# Patient Record
Sex: Female | Born: 1950 | Race: White | Hispanic: No | Marital: Single | Smoking: Current every day smoker
Health system: Southern US, Community
[De-identification: ages and names within clinical notes are randomized; demographics above are authoritative.]

## PROBLEM LIST (undated history)

## (undated) DIAGNOSIS — J449 Chronic obstructive pulmonary disease, unspecified: Secondary | ICD-10-CM

## (undated) DIAGNOSIS — R188 Other ascites: Secondary | ICD-10-CM

## (undated) DIAGNOSIS — K219 Gastro-esophageal reflux disease without esophagitis: Secondary | ICD-10-CM

## (undated) DIAGNOSIS — M1990 Unspecified osteoarthritis, unspecified site: Secondary | ICD-10-CM

## (undated) DIAGNOSIS — K7460 Unspecified cirrhosis of liver: Secondary | ICD-10-CM

## (undated) DIAGNOSIS — I509 Heart failure, unspecified: Secondary | ICD-10-CM

## (undated) DIAGNOSIS — G43909 Migraine, unspecified, not intractable, without status migrainosus: Secondary | ICD-10-CM

## (undated) DIAGNOSIS — N189 Chronic kidney disease, unspecified: Secondary | ICD-10-CM

## (undated) DIAGNOSIS — F419 Anxiety disorder, unspecified: Secondary | ICD-10-CM

---
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
## 1997-07-31 ENCOUNTER — Other Ambulatory Visit: Admission: RE | Admit: 1997-07-31 | Discharge: 1997-07-31 | Payer: Self-pay | Admitting: Gynecology

## 1997-08-27 ENCOUNTER — Other Ambulatory Visit: Admission: RE | Admit: 1997-08-27 | Discharge: 1997-08-27 | Payer: Self-pay | Admitting: Gynecology

## 1999-01-10 ENCOUNTER — Other Ambulatory Visit: Admission: RE | Admit: 1999-01-10 | Discharge: 1999-01-10 | Payer: Self-pay | Admitting: Gynecology

## 1999-02-01 ENCOUNTER — Encounter: Payer: Self-pay | Admitting: Gynecology

## 1999-02-02 ENCOUNTER — Inpatient Hospital Stay (HOSPITAL_COMMUNITY): Admission: RE | Admit: 1999-02-02 | Discharge: 1999-02-03 | Payer: Self-pay | Admitting: Gynecology

## 1999-02-02 ENCOUNTER — Encounter (INDEPENDENT_AMBULATORY_CARE_PROVIDER_SITE_OTHER): Payer: Self-pay | Admitting: Specialist

## 2000-01-26 ENCOUNTER — Other Ambulatory Visit: Admission: RE | Admit: 2000-01-26 | Discharge: 2000-01-26 | Payer: Self-pay | Admitting: Gynecology

## 2000-04-18 ENCOUNTER — Ambulatory Visit (HOSPITAL_COMMUNITY): Admission: RE | Admit: 2000-04-18 | Discharge: 2000-04-18 | Payer: Self-pay | Admitting: Gastroenterology

## 2001-02-26 ENCOUNTER — Other Ambulatory Visit: Admission: RE | Admit: 2001-02-26 | Discharge: 2001-02-26 | Payer: Self-pay | Admitting: Gynecology

## 2002-03-20 ENCOUNTER — Other Ambulatory Visit: Admission: RE | Admit: 2002-03-20 | Discharge: 2002-03-20 | Payer: Self-pay | Admitting: Gynecology

## 2002-09-30 ENCOUNTER — Ambulatory Visit (HOSPITAL_COMMUNITY): Admission: RE | Admit: 2002-09-30 | Discharge: 2002-09-30 | Payer: Self-pay | Admitting: Family Medicine

## 2002-09-30 ENCOUNTER — Encounter: Payer: Self-pay | Admitting: Family Medicine

## 2002-10-24 ENCOUNTER — Ambulatory Visit (HOSPITAL_COMMUNITY): Admission: RE | Admit: 2002-10-24 | Discharge: 2002-10-24 | Payer: Self-pay | Admitting: Neurosurgery

## 2002-10-24 ENCOUNTER — Encounter: Payer: Self-pay | Admitting: Neurosurgery

## 2003-06-02 ENCOUNTER — Other Ambulatory Visit: Admission: RE | Admit: 2003-06-02 | Discharge: 2003-06-02 | Payer: Self-pay | Admitting: Gynecology

## 2003-12-28 ENCOUNTER — Ambulatory Visit (HOSPITAL_COMMUNITY): Admission: RE | Admit: 2003-12-28 | Discharge: 2003-12-28 | Payer: Self-pay | Admitting: Family Medicine

## 2004-01-21 IMAGING — XA IR ANGIO/ADD [PERSON_NAME]
1 series · 16 of 24 positions shown · IV contrast (omnipaque)
Comparison: none

FINDINGS
CLINICAL DATA: PATIENT WITH SEVERE HEADACHES ASSOCIATED WITH VISUAL DISTURBANCES.  MRI SUSPICIOUS
OF INTRACRANIAL ARTERIAL VENOUS MALFORMATION.
FOUR VESSEL CEREBRAL ARTERIOGRAM
FOLLOWING A FULL EXPLANATION OF THE PROCEDURE ALONG WITH THE POTENTIALLY ASSOCIATED COMPLICATIONS,
AN INFORMED WITNESSED CONSENT WAS OBTAINED.
THE RIGHT GROIN WAS PREPPED AND DRAPED IN THE USUAL STERILE FASHION.  THEREAFTER USING A MODIFIED
SELDINGER TECHNIQUE, TRANSFEMORAL ACCESS INTO THE RIGHT COMMON FEMORAL ARTERY WAS OBTAINED WITHOUT
DIFFICULTY.  OVER A 0.035 INCH GUIDE WIRE, A 5 FRENCH PINNACLE SHEATH WAS INSERTED.
THROUGH THIS AND ALSO OVER A 0.035 INCH GUIDE WIRE, A 5 FRENCH JB-1 CATHETER WAS ADVANCED INTO THE
AORTIC ARCH REGION AND SELECTIVE CANNULATION ARTERIOGRAMS WERE PERFORMED OF THE RIGHT VERTEBRAL
ARTERY, THE RIGHT COMMON CAROTID ARTERY, THE LEFT COMMON CAROTID ARTERY AND THE LEFT VERTEBRAL
ARTERY AND THE LEFT SUBCLAVIAN ARTERIES.  THERE WERE NO ACUTE COMPLICATIONS AND THE PATIENT
TOLERATED THE PROCEDURE WELL.
MEDICATIONS UTILIZED:  VERSED 2 MG IV.  FENTANYL 50 MICROGRAMS IV.
CONTRAST UTILIZED:  OMNIPAQUE 300, APPROXIMATELY 85 CC.

[Series 1: run · 16 of 116 slices shown]
[im 1/116]
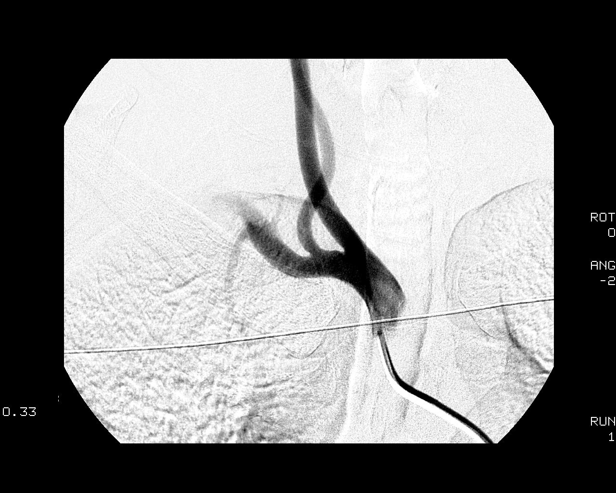
[im 11/116]
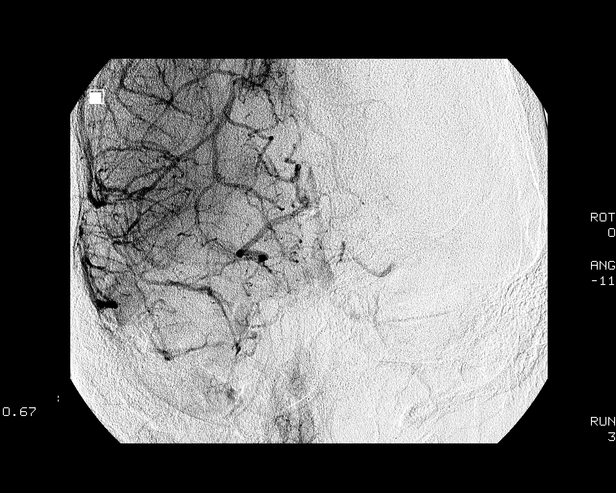
[im 16/116]
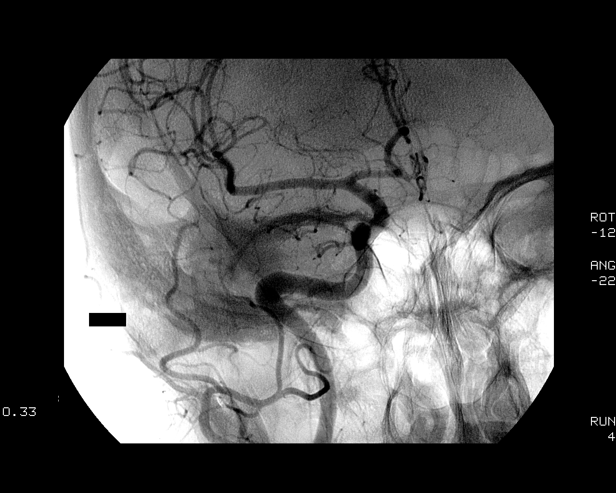
[im 26/116]
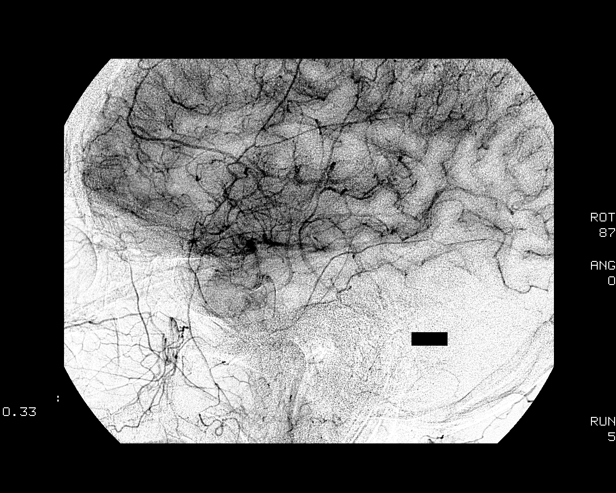
[im 31/116]
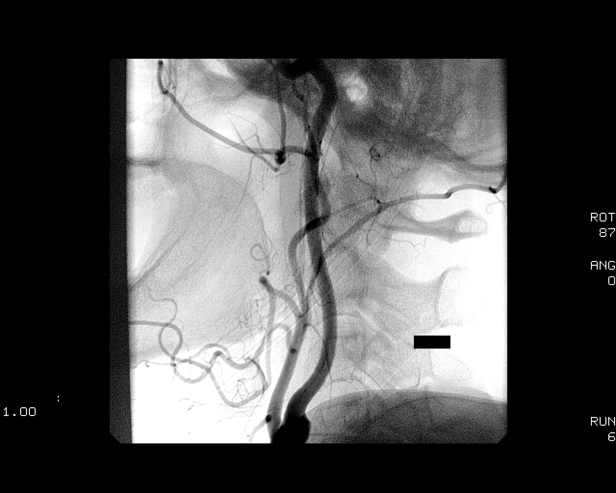
[im 41/116]
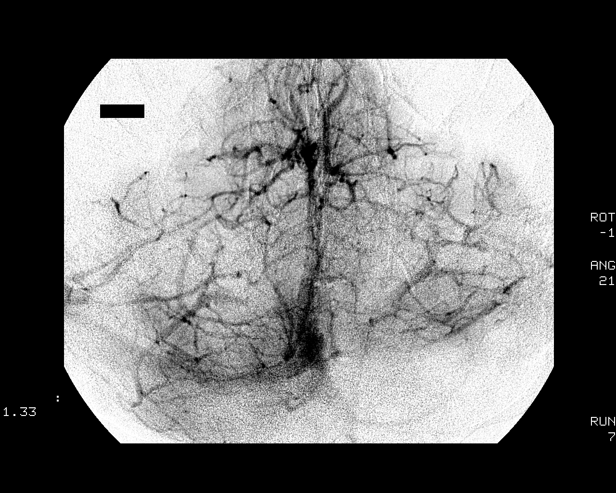
[im 46/116]
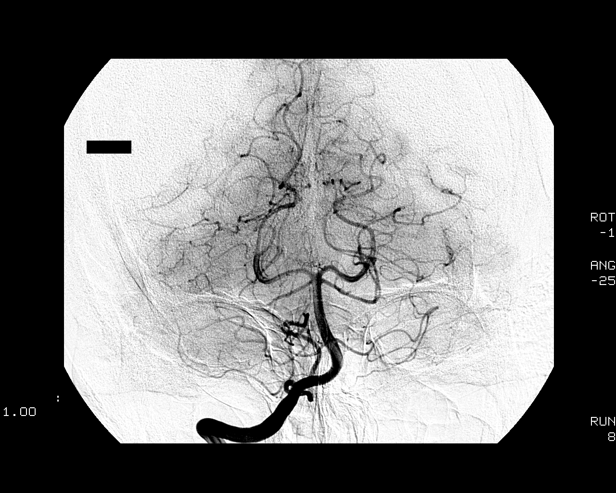
[im 56/116]
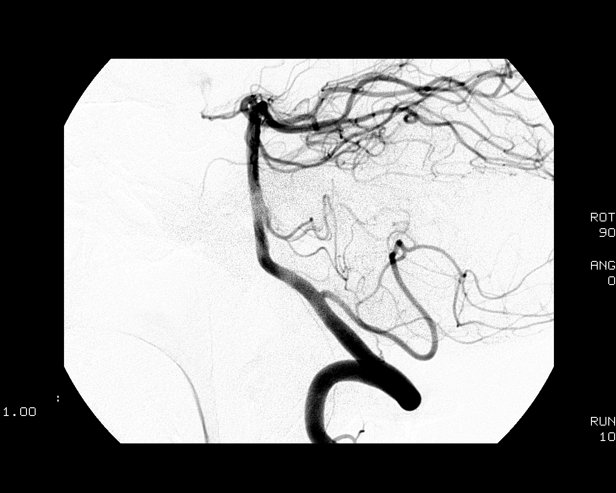
[im 61/116]
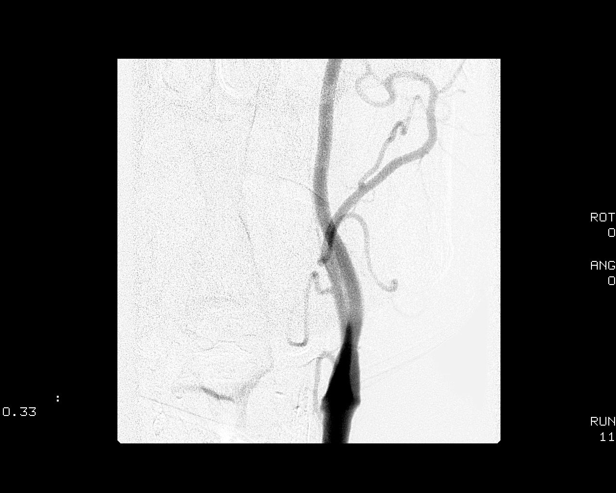
[im 71/116]
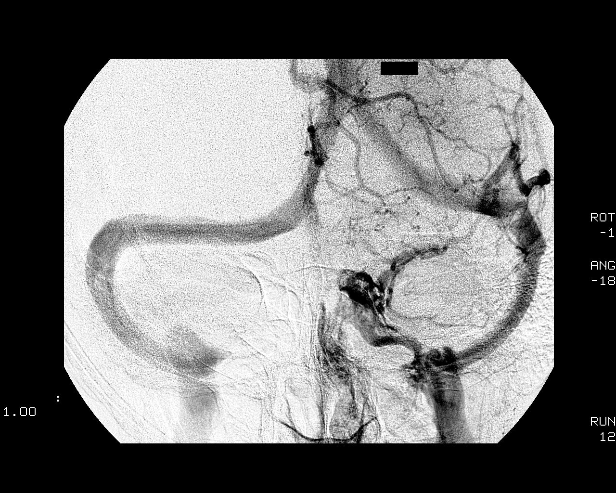
[im 76/116]
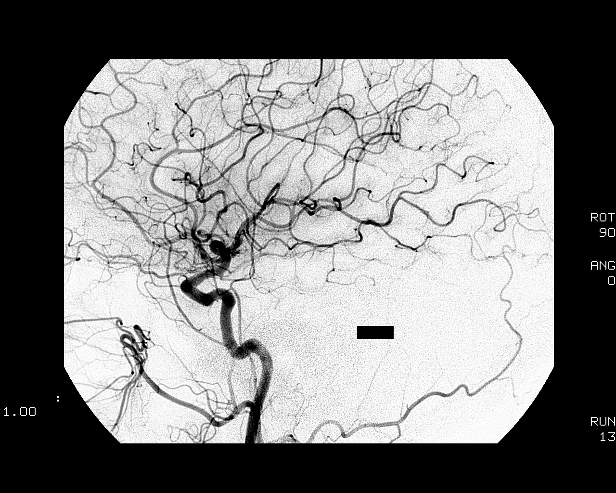
[im 86/116]
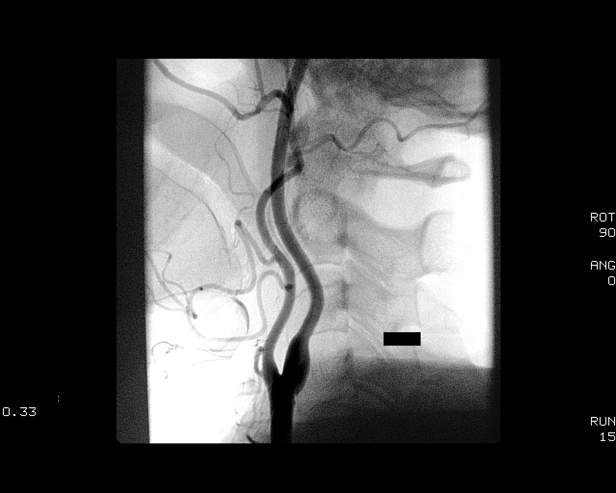
[im 91/116]
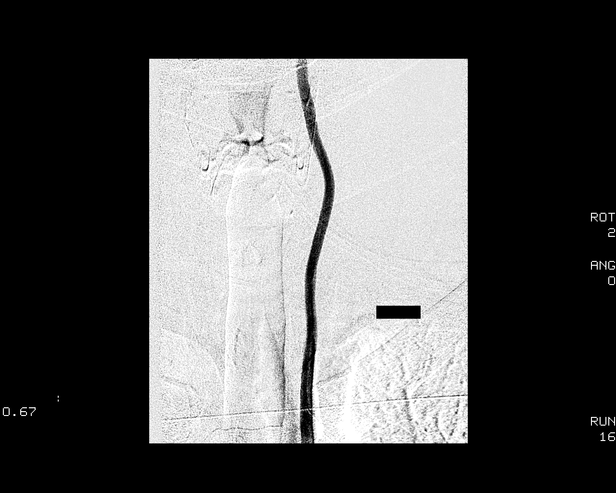
[im 101/116]
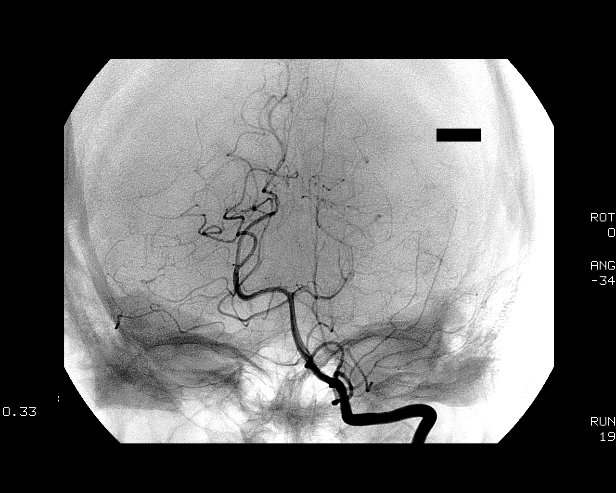
[im 106/116]
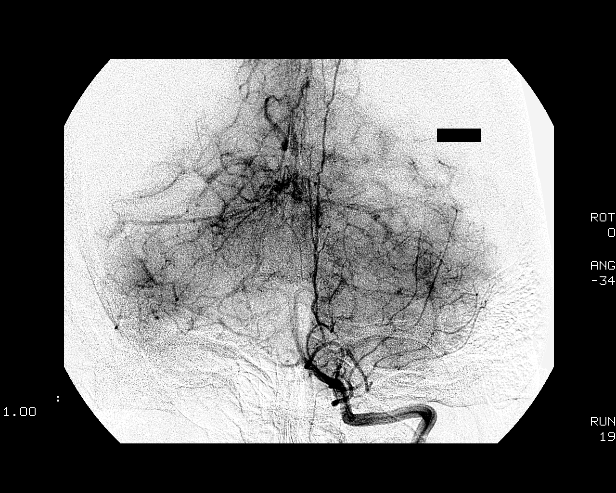
[im 116/116]
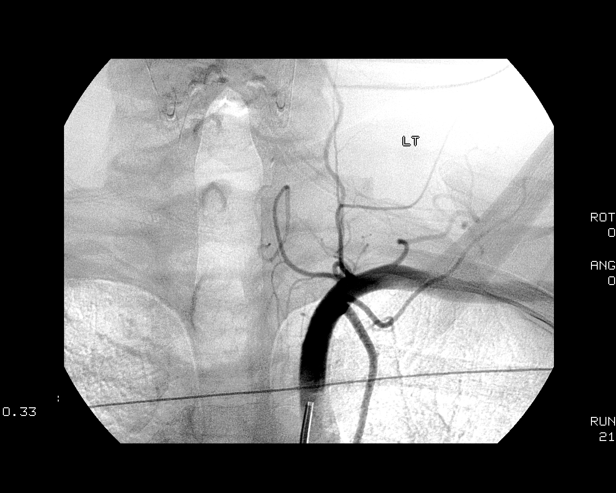

[16 of 24 positions shown; findings below may reference images not displayed]

FINDINGS: THE RIGHT VERTEBRAL ARTERY ORGIN IS NORMAL.  THIS VESSEL ASCENDS NORMALLY THROUGH THE
CRANIAL SKULL BASE.  THE RIGHT VERTEBROBASILAR JUNCTION, THE RIGHT POSTERIOR-INFERIOR CEREBELLAR
ARTERY, THE POSTERIOR CEREBRAL ARTERIES, THE SUPERIOR CEREBELLAR ARTERIES AND THE ANTERIOR-INFERIOR
CEREBELLAR ARTERIES OPACIFY NORMALLY INTO CAPILLARY AND VENOUS PHASES.
THE RIGHT COMMON CAROTID ARTERIOGRAM DEMONSTRATES THE RIGHT EXTERNAL CAROTID ARTERY ORIGIN AND
BRANCHES TO BE NORMAL.
THE RIGHT INTERNAL CAROTID ARTERY AT THE BULB AND DISTALLY IS NORMAL.  THE PETROUS, CAVERNOUS AND
THE SUPRACLINOID SEGMENTS ARE NORMALLY OPACIFIED.
THERE IS A 1.5 MM SACCULAR ANEURYSM IN THE CAVAL PORTION OF THE RIGHT INTERNAL CAROTID ARTERY.  AN
INFUNDIBULUM IS SEEN IN THE REGION OF THE RIGHT P-COM.
THE RIGHT MIDDLE AND THE RIGHT ANTERIOR CEREBRAL ARTERIES OPACIFY NORMALLY INTO CAPILLARY AND
VENOUS PHASES.  TRANSIT OPACIFICATION OF THE A-COM IS NOTED FROM THE RIGHT INTERNAL CAROTID ARTERY
INJECTION AND IS NORMAL.
THE LEFT COMMON CAROTID ARTERIOGRAM DEMONSTRATES THE LEFT EXTERNAL CAROTID ARTERY ORIGIN AND
BRANCHES TO BE NORMAL.  THE LEFT INTERNAL CAROTID ARTERY AT THE BULB AND DISTALLY IS NORMAL.  THE
PETROUS, CAVERNOUS AND THE SUPRACLINOID SEGMENTS ARE NORMALLY OPACIFIED.  THE LEFT MIDDLE AND LEFT
ANTERIOR CEREBRAL ARTERIES ALSO OPACIFY NORMALLY INTO THE CAPILLARY AND THE VENOUS PHASES.
THERE OPACIFICATION OF THE A-COM FROM THE LEFT INTERNAL CAROTID ARTERY INJECTION AND IS GROSSLY
NORMAL.
THE LEFT VERTEBRAL ARTERY ARISES FROM THE AORTIC ARCH.  THIS VESSEL ASCENDS NORMALLY TO THE CRANIAL
SKULL BASE.  THERE IS NORMAL OPACIFICATION OF THE LEFT VERTEBROBASILAR JUNCTION, THE LEFT POSTERIOR-
INFERIOR CEREBELLAR ARTERY, THE BASILAR ARTERY AND THE VACULAR PORTION OF THE POSTERIOR CEREBRAL
ARTERIES, THE SUPERIOR CEREBELLAR ARTERIES AND THE ANTERIOR-INFERIOR CEREBELLAR ARTERIES TO BE
NORMAL INTO CAPILLARY AND VENOUS PHASES.
THE LEFT SUBCLAVIAN ARTERIOGRAM DEMONSTRATES THE ORIGINS TO BE NORMAL.  THE COSTOCERVICAL TRUNK AND
THYROCERVICAL TRUNK DEMONSTRATE NORMAL BRANCHING PATTERNS AND OPACIFICATION.  DISTAL LEFT
SUBCLAVIAN ARTERY TO THE AXILLARY ARTERY IS ALSO NORMALLY OPACIFIED.
IMPRESSION
1.  NO INTRACRANIAL ANGIOGRAPHIC EVIDENCE OF AN ARTERIAL VENOUS MALFORMATION, DURAL ARTERIAL VENOUS
FISTULA, OR OF EXTRACRANIAL OR INTRACRANIAL DISSECTIONS.
2.  SMALL 1.5 MM SACCULAR ANEURYSM IN THE CAVAL PORTION OF THE RIGHT INTERNAL CAROTID ARTERY.
3.  RIGHT VERTEBRAL ARTERY ARISES FROM THE AORTIC ARCH.
4.  LEFT THYROCERVICAL TRUNK AND COSTOCERVICAL TRUNK TO BE UNREMARKABLE.

## 2004-02-05 ENCOUNTER — Encounter: Admission: RE | Admit: 2004-02-05 | Discharge: 2004-02-05 | Payer: Self-pay | Admitting: Neurosurgery

## 2004-06-20 ENCOUNTER — Other Ambulatory Visit: Admission: RE | Admit: 2004-06-20 | Discharge: 2004-06-20 | Payer: Self-pay | Admitting: Gynecology

## 2005-06-20 ENCOUNTER — Other Ambulatory Visit: Admission: RE | Admit: 2005-06-20 | Discharge: 2005-06-20 | Payer: Self-pay | Admitting: Gynecology

## 2008-02-17 ENCOUNTER — Inpatient Hospital Stay (HOSPITAL_COMMUNITY): Admission: EM | Admit: 2008-02-17 | Discharge: 2008-02-23 | Payer: Self-pay | Admitting: Emergency Medicine

## 2009-02-27 HISTORY — PX: CATARACT EXTRACTION W/ INTRAOCULAR LENS  IMPLANT, BILATERAL: SHX1307

## 2010-07-12 NOTE — Discharge Summary (Signed)
Abigail Crawford, Abigail Crawford                 ACCOUNT NO.:  [id number]   MEDICAL RECORD NO.:  [id number]          PATIENT TYPE:  INP   LOCATION:  5148                         FACILITY:  [facility]   PHYSICIAN:  Richarda Overlie, MD       DATE OF BIRTH:  07-14-1950   DATE OF ADMISSION:  02/17/2008  DATE OF DISCHARGE:  02/23/2008                               DISCHARGE SUMMARY   Kindly refer to discharge summary from February 22, 2008 done by Dr.  Janice Norrie.   Diagnosis remains unchanged.   INTERIM DISCHARGE SUMMARY:  The patient has been hemodynamically stable  and found to be around 97% on room air.  No active wheezing noted.  The  patient's abdominal distention is unchanged.  The patient was continued  on treatments and p.o. antibiotics for SBP as well as her medications  for cirrhosis and ascites to which she has responded well.  No new  culture data on the day of discharge.  Case manager requested to help  arrange for a primary care Yareli Carthen, home health, as well as to help  with her medications.  Social worker requested to provide information  regarding alcohol cessation.   DISCHARGE MEDICATIONS:  1. Lasix 20 mg daily.  2. Ciprofloxacin 500 mg p.o. b.i.d. for 5 days.  3. Propranolol 10 mg p.o. daily.  4. Aldactone/spironolactone 25 mg p.o. daily.  5. Nicotine patch 21 mg per day.  6. Ultram 50 mg p.o. q. 12.      Richarda Overlie, MD  Electronically Signed     NA/MEDQ  D:  02/23/2008  T:  02/23/2008  Job:  [id number]

## 2010-07-12 NOTE — Discharge Summary (Signed)
Abigail Crawford, Abigail Crawford                 ACCOUNT NO.:  [id number]   MEDICAL RECORD NO.:  [id number]          PATIENT TYPE:  INP   LOCATION:  5148                         FACILITY:  [facility]   PHYSICIAN:  Renee Ramus, MD       DATE OF BIRTH:  1950/11/26   DATE OF ADMISSION:  02/17/2008  DATE OF DISCHARGE:                               DISCHARGE SUMMARY   PRIMARY/DISCHARGE DIAGNOSIS:  Spontaneous bacterial peritonitis.   SECONDARY DIAGNOSES:  1. Cirrhosis secondary to alcohol abuse.  2. Anemia.  3. Ascites.  4. Hypertension.  5. Chronic kidney disease stage II.   HOSPITAL COURSE:  1. Spontaneous bacterial peritonitis.  The patient is a 60 year old      female who was admitted secondary to abdominal pain.  The patient      was not previously diagnosed with cirrhosis.  The patient has also      been experiencing progressive abdominal distention, which added to      her pain.  The patient was seen in the emergency department, was      admitted to our service.  The patient did undergo abdominal      ultrasound showing cirrhosis and ascites.  The patient did undergo      abdominocentesis showing evidence of spontaneous bacterial      peritonitis.  The patient has been started on antibiotics and had      been transitioned to ciprofloxacin and we will continue with a 5-      day course post-discharge.  The patient does admit to drinking 4-5      beers per day, smoking 1 pack of cigarettes per day, but has not      had any evidence of withdrawal while in-house.  The patient      realizes that she cannot drink anymore and that she should not      smoke anymore.  The patient is being discharged to home with      followup with her primary care physician at [facility].  2. Cirrhosis.  As above, the patient will be placed on Lasix and      Aldactone.  She is not a candidate for steroid therapy.  Her MELD      score is approximately 13.  She has a Child class B liver failure   with a creatinine clearance of 36.  The patient will be placed on      low-sodium diet to avoid ascites.  3. Anemia, currently stable.  The patient has adequate iron stores for      erythropoiesis and likely is suffering from anemia from chronic      kidney disease.  4. Ascites.  As above, the patient has undergone abdominocentesis and      may need to have that done regularly as an outpatient if it re-      accumulates.  5. Hypertension.  This has been relatively well controlled, in fact,      the patient has been somewhat hypotensive.  She is on propranolol  as prophylaxis against gastric and esophageal varices.  6. Chronic kidney disease stage II.  This has been stable throughout      this admission.   LABORATORY DATA:  1. Initial leukocytosis, white count of 11 decreasing to 10.1 on day      prior to discharge.  2. Hemoglobin of 9.5, hematocrit of 28.6.  3. Macrocytosis with an MCV of 102.1.  4. INR of 1.2.  5. Elevated creatinine at 1.41.  6. Normal AST and ALT.  7. Low albumin at 1.3 with calcium of 8.3.  8. B12 of 929 and folate of 804.  9. Serum iron of 36 with ferritin of 210.  10.Body fluid analysis from her abdominocentesis shows white blood      cell count of 905 with yellow hazy color and 63 of which were      segmented neutrophils.  11.UA showing concentrated urine of specific gravity of 1.023 with      small amount of leukocytes.   STUDIES:  1. Ultrasound of the abdomen showing cirrhosis and ascites with      multiple small gallstones, but no evidence of cholecystitis.  2. Chest x-ray showing cardiomegaly without failure.   MEDICATIONS ON DISCHARGE:  1. Lasix 20 mg p.o. daily.  2. Ciprofloxacin 500 mg p.o. b.i.d. x5 days.  3. Propranolol 10 mg p.o. daily.  4. Aldactone 25 mg p.o. daily.  5. Albuterol neb 1 inhalation q.6 h. p.r.n. wheezes.   The patient's discharge will be on hold until social worker has seen her  and some discharge problems with  regards to finances have been in dealt  with.  The patient is in stable condition for discharge.   TIME SPENT:  35 minutes.      Renee Ramus, MD  Electronically Signed     JF/MEDQ  D:  02/22/2008  T:  02/23/2008  Job:  [id number]   cc:   [facility]

## 2010-07-12 NOTE — H&P (Signed)
NAMEALEZA, PEW NO.:  [id number]   MEDICAL RECORD NO.:  [id number]          PATIENT TYPE:  EMS   LOCATION:  MAJO                         FACILITY:  [facility]   PHYSICIAN:  Lonia Blood, M.D.DATE OF BIRTH:  [birth year]   DATE OF ADMISSION:  02/17/2008  DATE OF DISCHARGE:                              HISTORY & PHYSICAL   PRIMARY CARE PHYSICIAN:  Production assistant, radio.   CHIEF COMPLAINT:  Abdominal pain.   HISTORY OF PRESENT ILLNESS:  Abigail Crawford is a 60 year old female who  does not receive regular medical care.  She has recently lost her health  insurance due to an ongoing dispute with her husband and has not been  taking her chronic medications for multiple months.  She cannot provide  me with the names of her usual home medications.  Note she is somewhat  rambling in her speech.  I am able to ascertain that she has been having  progressive abdominal distention for approximately 1-2 months now.  Over  the last 48 hours this distention has become so severe that she is  having severe pain.  This is diffuse pain.  It is not associated with  diarrhea or constipation.  There have been no fevers or chills that she  can appreciate.  With this she has had progressive shortness of breath.  She states that she intermittently wheezes and has a dry cough.  She  denies focal chest pain.  There has been no nausea or vomiting.  The  patient also reports that she has really bad reflux and has not been  using her AcipHex and has therefore had multiple episodes of severe  reflux type esophagitis.  She admits to continuing use of 4-5 beers per  day and smoking 1 pack per day, but states that this is a significant  improvement from her 2 pack a day and 12-pack of beer a day habit as  recent as in the last year.   REVIEW OF SYSTEMS:  Comprehensive review of systems is also significant  for severe bilateral lower extremity edema which is much worse when she  is  standing.  Otherwise comprehensive review of systems is unremarkable  with the exception of the positive elements noted in the history of  present illness above.   PAST MEDICAL HISTORY:  1. Cirrhosis of the liver  2. EGD February 2002 revealing a mild antral gastritis.  3. Cholelithiasis by ultrasound today.  4. Tobacco abuse in the amount of 1 pack per day since 60 years old      but as high as 2 packs per day in the past.  5. Ongoing alcohol abuse in the amount of 4-5 beers per day with      previous history of 12 beers per day.  6. Unspecified gynecologic surgery approximately 5 years ago.   OUTPATIENT MEDICATIONS:  None.   ALLERGIES:  Aspirin.   FAMILY HISTORY:  Noncontributory.   SOCIAL HISTORY:  [place].  She is presently separated from her husband and this is  not an  amicable split.  She has recently lost her health insurance as a result.   DATA REVIEWED:  BNP is normal.  Lipase is normal.  Chest x-ray reveals  cardiomegaly but is otherwise unrevealing.  A 12-lead EKG reveals normal  sinus rhythm at 70 beats per minute.  CBC is unremarkable with the  exception to an MCV of 101.  Sodium is low at 132, potassium, chloride,  bicarb, BUN and creatinine are normal.  Serum glucose is normal.  AST is  41, ALT is 17, alk phos is 126, total bili is 1.8, albumin is 2.3 with  an __________ of 7.0.   PHYSICAL EXAMINATION:  Temperature 97.4,  blood pressure 158/78, heart  rate 74, respiratory rate 22, O2 sat is 100% on room air.  GENERAL:  Well-developed, well-nourished female in no acute respiratory  distress.  HEENT:  Normocephalic, atraumatic.  Pupils equal, round, react to light  and accommodation.  Extraocular muscles intact bilaterally.  Oral mucosa  is noted to be clear.  NECK:  JVD to the angle of the jaw at approximately 25 degrees.  LUNGS:  Mild bibasilar crackles with mild expiratory wheeze throughout  but good air  movement in general.  CARDIOVASCULAR:  Distant but regular without gallop or rub.  ABDOMEN:  Markedly distended/protuberant but soft.  Tender to palpation  throughout all 4 quadrants.  No focal mass nor organomegaly is  appreciable but it would easily be missed with the patient's ascites.  There is a positive fluid wave.  There is no rebound.  EXTREMITIES:  2+ bilateral lower extremity edema to the upper thigh, no  significant cyanosis or clubbing.  No erythema.  NEUROLOGIC:  Alert and oriented x4.  Cranial nerves II-XII intact  bilaterally.  There is no asterixis at the present time.  She displays  5/5 strength upper and lower extremities.   IMPRESSION AND PLAN:  1. Abdominal pain:  At this point we will need to rule out the      possibility of spontaneous bacterial peritonitis.  I am not      sufficiently concerned about this however to proceed immediately to      paracentesis.  We will treat her initially with Rocephin.  We will      monitor her clinically.  Should she fail to experience significant      improvement of her symptoms with diuresis, we will need to consider      a combined therapeutic/diagnostic paracentesis.  It is my hope,      however, that the patient's pain is simply due to mechanical      stretching of the abdominal wall and that with Lasix, Aldactone      therapy alone she will be significantly improved.  If her symptoms      precipitously improve, it would be reasonable to transition her      immediately to p.o. antibiotics and simply complete a 10-day      course.  2. Alcoholism:  I have discussed with the patient at length the fact      that alcohol is to blame for all of her presenting problems at      present.  She states that her liver disease is actually due to a      medication I took in the past.  I have informed her that      regardless of the impact medication may have had on her liver, that      her continued  high level alcohol abuse is certainly  worsening her      liver, if not the primary cause of her cirrhosis.  I have advised      her that if she does not discontinue alcohol abuse immediately that      she will progress to fulminate hepatic failure and require a liver      transplant if she wishes to prolong her life.  3. Cirrhosis of liver:  The patient states this is due to a      medication I took.  The patient's alcohol intake is clearly      sufficient enough to cause cirrhosis in and of itself.  I have      counseled her on such as noted above.  It does not appear that the      patient is receiving regular medical care at the present time due      to financial reasons.  We will check a PT and PTT.  We will check      an ammonia level.  I will empirically screen the patient for      hepatitis with an acute hepatitis panel.  We will initiate empiric      beta blocker for prophylaxis against varices.  4. Shortness of breath:  On clinical exam and via x-ray the patient      does display signs of emphysema.  She has mild expiratory wheezing      at the present time.  The majority of her shortness of breath is      likely due to an abdominal compartment syndrome given her      significant anasarca/ascites.  At the present time, however, the      patient is not in extremis and I do not feel the need to proceed      with an emergent paracentesis.  We will diurese the patient.  I      will place her on empiric nebulizers.  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  I have again advised her that regardless of her previous      chemical exposure, the ongoing tobacco abuse is without a doubt      further damaging her lungs, and      I have advised her that she should discontinue smoking immediately.      We will provide her  with a nicotine patch and we will request a      tobacco cessation consultation to further encourage my      recommendation of abstinence.      Lonia Blood, M.D.  Electronically Signed     JTM/MEDQ  D:  02/17/2008  T:  02/17/2008  Job:  [id number]   cc:   [facility]

## 2010-07-15 NOTE — Procedures (Signed)
[place]  Patient:    Abigail Crawford, Abigail Crawford                      MRN: [id number] Proc. Date: 04/18/00 Adm. Date:  [id number] Attending:  Charna Elizabeth CC:         Nolon Nations, M.D., M.P.H.   Procedure Report  DATE OF BIRTH:  [birth year]  REFERRING PHYSICIAN:  Nolon Nations, M.D., M.P.H.  PROCEDURE PERFORMED:  Esophagogastroduodenoscopy.  ENDOSCOPIST:  Anselmo Rod, M.D.  INSTRUMENT USED:  Olympus video panendoscope.  INDICATIONS FOR PROCEDURE:  Epigastric pain with nausea and vomiting and abnormal weight loss in a 60 year old white female.  Rule out peptic ulcer disease, esophagitis, gastritis, etc.  PREPROCEDURE PREPARATION:  Informed consent was procured from the patient. The patient was fasted for eight hours prior to the procedure.  PREPROCEDURE PHYSICAL:  The patient had stable vital signs.  Neck supple. Chest clear to auscultation.  S1, S2 regular.  Abdomen soft with normal abdominal bowel sounds.  Severe epigastric left upper quadrant and left lower quadrant tenderness on palpation with guarding.  DESCRIPTION OF PROCEDURE:  The patient was placed in left lateral decubitus position and sedated with 75 mg of Demerol and 7.5 mg of Versed intravenously. Once the patient was adequately sedated and maintained on low-flow oxygen and continuous cardiac monitoring, the Olympus video panendoscope was advanced through the mouthpiece, over the tongue, into the esophagus under direct vision.  The entire esophagus appeared normal without evidence of ring, stricture, masses, lesions, esophagitis or Barretts mucosa.  The scope was then advanced to the stomach.  There was prominent erythema in the antrum consistent with antral gastritis.  No frank ulcers, erosions were seen.  The proximal small bowel appeared normal.  There was no evidence of a hiatal hernia.  IMPRESSION:  Moderate antral gastritis.  Otherwise normal  EGD.  RECOMMENDATION: 1. CBC with differential.  Amylase, lipase and a CMET will be checked    today. 2. The patient has strongly been advised against the use of all nonsteroidals    including aspirin. 3. Continue proton pump inhibitor for now. 4. Colonoscopy next week because of a family history of colon cancer. 5. Outpatient follow-up after the colonoscopy. DD:  04/18/00 TD:  04/18/00 Job: [id number]

## 2010-12-01 LAB — CULTURE, BLOOD (ROUTINE X 2): Culture: NO GROWTH

## 2010-12-01 LAB — COMPREHENSIVE METABOLIC PANEL
ALT: 13 U/L (ref 0–35)
ALT: 14 U/L (ref 0–35)
ALT: 17 U/L (ref 0–35)
AST: 27 U/L (ref 0–37)
Albumin: 1.8 g/dL — ABNORMAL LOW (ref 3.5–5.2)
Albumin: 1.9 g/dL — ABNORMAL LOW (ref 3.5–5.2)
Albumin: 1.9 g/dL — ABNORMAL LOW (ref 3.5–5.2)
Albumin: 1.9 g/dL — ABNORMAL LOW (ref 3.5–5.2)
Alkaline Phosphatase: 126 U/L — ABNORMAL HIGH (ref 39–117)
Alkaline Phosphatase: 75 U/L (ref 39–117)
Alkaline Phosphatase: 78 U/L (ref 39–117)
Alkaline Phosphatase: 88 U/L (ref 39–117)
Alkaline Phosphatase: 93 U/L (ref 39–117)
BUN: 13 mg/dL (ref 6–23)
BUN: 13 mg/dL (ref 6–23)
BUN: 13 mg/dL (ref 6–23)
CO2: 27 mEq/L (ref 19–32)
Chloride: 102 mEq/L (ref 96–112)
Chloride: 104 mEq/L (ref 96–112)
Creatinine, Ser: 1.48 mg/dL — ABNORMAL HIGH (ref 0.4–1.2)
GFR calc Af Amer: 49 mL/min — ABNORMAL LOW (ref >60)
GFR calc non Af Amer: 40 mL/min — ABNORMAL LOW (ref >60)
Glucose, Bld: 102 mg/dL — ABNORMAL HIGH (ref 70–99)
Glucose, Bld: 116 mg/dL — ABNORMAL HIGH (ref 70–99)
Glucose, Bld: 98 mg/dL (ref 70–99)
Glucose, Bld: 99 mg/dL (ref 70–99)
Potassium: 3.4 mEq/L — ABNORMAL LOW (ref 3.5–5.1)
Potassium: 3.7 mEq/L (ref 3.5–5.1)
Potassium: 3.7 mEq/L (ref 3.5–5.1)
Potassium: 3.8 mEq/L (ref 3.5–5.1)
Potassium: 3.8 mEq/L (ref 3.5–5.1)
Sodium: 132 mEq/L — ABNORMAL LOW (ref 135–145)
Sodium: 136 mEq/L (ref 135–145)
Sodium: 138 mEq/L (ref 135–145)
Total Bilirubin: 1.1 mg/dL (ref 0.3–1.2)
Total Bilirubin: 1.4 mg/dL — ABNORMAL HIGH (ref 0.3–1.2)
Total Bilirubin: 1.8 mg/dL — ABNORMAL HIGH (ref 0.3–1.2)
Total Protein: 5.3 g/dL — ABNORMAL LOW (ref 6.0–8.3)
Total Protein: 5.4 g/dL — ABNORMAL LOW (ref 6.0–8.3)
Total Protein: 5.6 g/dL — ABNORMAL LOW (ref 6.0–8.3)
Total Protein: 7 g/dL (ref 6.0–8.3)

## 2010-12-01 LAB — DIFFERENTIAL
Basophils Absolute: 0 10*3/uL (ref 0.0–0.1)
Basophils Relative: 0 % (ref 0–1)
Eosinophils Absolute: 0.2 10*3/uL (ref 0.0–0.7)
Monocytes Absolute: 0.6 10*3/uL (ref 0.1–1.0)
Monocytes Relative: 6 % (ref 3–12)
Neutro Abs: 5.9 10*3/uL (ref 1.7–7.7)
Neutrophils Relative %: 59 % (ref 43–77)

## 2010-12-01 LAB — CBC
HCT: 28.9 % — ABNORMAL LOW (ref 36.0–46.0)
HCT: 29.5 % — ABNORMAL LOW (ref 36.0–46.0)
HCT: 29.9 % — ABNORMAL LOW (ref 36.0–46.0)
HCT: 37.7 % (ref 36.0–46.0)
Hemoglobin: 12.7 g/dL (ref 12.0–15.0)
Hemoglobin: 9.7 g/dL — ABNORMAL LOW (ref 12.0–15.0)
Hemoglobin: 9.7 g/dL — ABNORMAL LOW (ref 12.0–15.0)
Hemoglobin: 9.8 g/dL — ABNORMAL LOW (ref 12.0–15.0)
Hemoglobin: 9.9 g/dL — ABNORMAL LOW (ref 12.0–15.0)
MCV: 102.8 fL — ABNORMAL HIGH (ref 78.0–100.0)
Platelets: 207 10*3/uL (ref 150–400)
Platelets: 214 10*3/uL (ref 150–400)
Platelets: 219 10*3/uL (ref 150–400)
Platelets: 225 10*3/uL (ref 150–400)
RBC: 2.8 MIL/uL — ABNORMAL LOW (ref 3.87–5.11)
RBC: 2.86 MIL/uL — ABNORMAL LOW (ref 3.87–5.11)
RBC: 3.73 MIL/uL — ABNORMAL LOW (ref 3.87–5.11)
RDW: 12.9 % (ref 11.5–15.5)
RDW: 12.9 % (ref 11.5–15.5)
RDW: 13.4 % (ref 11.5–15.5)
RDW: 13.4 % (ref 11.5–15.5)
RDW: 13.4 % (ref 11.5–15.5)
WBC: 10.1 10*3/uL (ref 4.0–10.5)
WBC: 10.1 10*3/uL (ref 4.0–10.5)
WBC: 10.6 10*3/uL — ABNORMAL HIGH (ref 4.0–10.5)
WBC: 10.9 10*3/uL — ABNORMAL HIGH (ref 4.0–10.5)

## 2010-12-01 LAB — IRON AND TIBC
Iron: 36 ug/dL — ABNORMAL LOW (ref 42–135)
Saturation Ratios: 27 % (ref 20–55)
TIBC: 131 ug/dL — ABNORMAL LOW (ref 250–470)

## 2010-12-01 LAB — FERRITIN: Ferritin: 210 ng/mL (ref 10–291)

## 2010-12-01 LAB — VITAMIN B12: Vitamin B-12: 929 pg/mL — ABNORMAL HIGH (ref 211–911)

## 2010-12-01 LAB — BASIC METABOLIC PANEL
BUN: 13 mg/dL (ref 6–23)
BUN: 17 mg/dL (ref 6–23)
Calcium: 8.3 mg/dL — ABNORMAL LOW (ref 8.4–10.5)
Creatinine, Ser: 1.41 mg/dL — ABNORMAL HIGH (ref 0.4–1.2)
Creatinine, Ser: 1.61 mg/dL — ABNORMAL HIGH (ref 0.4–1.2)
GFR calc non Af Amer: 33 mL/min — ABNORMAL LOW (ref >60)
GFR calc non Af Amer: 38 mL/min — ABNORMAL LOW (ref >60)
Glucose, Bld: 95 mg/dL (ref 70–99)

## 2010-12-01 LAB — URINE MICROSCOPIC-ADD ON

## 2010-12-01 LAB — URINALYSIS, ROUTINE W REFLEX MICROSCOPIC
Bilirubin Urine: NEGATIVE
Bilirubin Urine: NEGATIVE
Glucose, UA: NEGATIVE mg/dL
Hgb urine dipstick: NEGATIVE
Ketones, ur: NEGATIVE mg/dL
Ketones, ur: NEGATIVE mg/dL
Leukocytes, UA: NEGATIVE
Nitrite: NEGATIVE
Protein, ur: NEGATIVE mg/dL
Specific Gravity, Urine: 1.014 (ref 1.005–1.030)
Specific Gravity, Urine: 1.023 (ref 1.005–1.030)
Urobilinogen, UA: 0.2 mg/dL (ref 0.0–1.0)
pH: 5 (ref 5.0–8.0)
pH: 5 (ref 5.0–8.0)
pH: 6 (ref 5.0–8.0)

## 2010-12-01 LAB — ANTI-SMITH ANTIBODY: ENA SM Ab Ser-aCnc: <0.2 AI (ref <1.0)

## 2010-12-01 LAB — TYPE AND SCREEN: ABO/RH(D): A POS

## 2010-12-01 LAB — URINE CULTURE: Colony Count: NO GROWTH

## 2010-12-01 LAB — BODY FLUID CELL COUNT WITH DIFFERENTIAL
Eos, Fluid: 1 %
Monocyte-Macrophage-Serous Fluid: 31 % — ABNORMAL LOW (ref 50–90)

## 2010-12-01 LAB — BODY FLUID CULTURE

## 2010-12-01 LAB — ABO/RH: ABO/RH(D): A POS

## 2010-12-01 LAB — PROTIME-INR: Prothrombin Time: 15.3 seconds — ABNORMAL HIGH (ref 11.6–15.2)

## 2010-12-01 LAB — ALBUMIN, FLUID (OTHER): Albumin, Fluid: <1 g/dL

## 2010-12-01 LAB — GLUCOSE, SEROUS FLUID

## 2010-12-01 LAB — AMMONIA: Ammonia: 16 umol/L (ref 11–35)

## 2010-12-01 LAB — HEMOGLOBIN A1C: Mean Plasma Glucose: 91 mg/dL

## 2010-12-01 LAB — POCT CARDIAC MARKERS

## 2010-12-01 LAB — B-NATRIURETIC PEPTIDE (CONVERTED LAB): Pro B Natriuretic peptide (BNP): 91 pg/mL (ref 0.0–100.0)

## 2010-12-01 LAB — PROTEIN, BODY FLUID: Total protein, fluid: <3 g/dL

## 2010-12-01 LAB — MAGNESIUM: Magnesium: 1.8 mg/dL (ref 1.5–2.5)

## 2010-12-01 LAB — HEPATITIS B SURFACE ANTIGEN: Hepatitis B Surface Ag: NEGATIVE

## 2011-02-28 ENCOUNTER — Inpatient Hospital Stay (HOSPITAL_COMMUNITY)
Admission: EM | Admit: 2011-02-28 | Discharge: 2011-03-04 | DRG: 641 | Disposition: A | Discharge status: Home Health | Payer: Medicare Other | Attending: Family Medicine | Admitting: Family Medicine

## 2011-02-28 ENCOUNTER — Encounter: Payer: Self-pay | Admitting: *Deleted

## 2011-02-28 ENCOUNTER — Other Ambulatory Visit: Payer: Self-pay

## 2011-02-28 ENCOUNTER — Emergency Department (HOSPITAL_COMMUNITY): Payer: Medicare Other

## 2011-02-28 DIAGNOSIS — R5381 Other malaise: Secondary | ICD-10-CM | POA: Diagnosis present

## 2011-02-28 DIAGNOSIS — Z886 Allergy status to analgesic agent status: Secondary | ICD-10-CM

## 2011-02-28 DIAGNOSIS — R531 Weakness: Secondary | ICD-10-CM | POA: Diagnosis present

## 2011-02-28 DIAGNOSIS — J449 Chronic obstructive pulmonary disease, unspecified: Secondary | ICD-10-CM | POA: Diagnosis present

## 2011-02-28 DIAGNOSIS — N189 Chronic kidney disease, unspecified: Secondary | ICD-10-CM | POA: Diagnosis present

## 2011-02-28 DIAGNOSIS — N390 Urinary tract infection, site not specified: Secondary | ICD-10-CM | POA: Diagnosis present

## 2011-02-28 DIAGNOSIS — Z9181 History of falling: Secondary | ICD-10-CM

## 2011-02-28 DIAGNOSIS — Z79899 Other long term (current) drug therapy: Secondary | ICD-10-CM

## 2011-02-28 DIAGNOSIS — K7030 Alcoholic cirrhosis of liver without ascites: Secondary | ICD-10-CM | POA: Diagnosis present

## 2011-02-28 DIAGNOSIS — K219 Gastro-esophageal reflux disease without esophagitis: Secondary | ICD-10-CM | POA: Diagnosis present

## 2011-02-28 DIAGNOSIS — J4489 Other specified chronic obstructive pulmonary disease: Secondary | ICD-10-CM | POA: Diagnosis present

## 2011-02-28 DIAGNOSIS — K7460 Unspecified cirrhosis of liver: Secondary | ICD-10-CM | POA: Diagnosis present

## 2011-02-28 DIAGNOSIS — I509 Heart failure, unspecified: Secondary | ICD-10-CM | POA: Diagnosis present

## 2011-02-28 DIAGNOSIS — F17200 Nicotine dependence, unspecified, uncomplicated: Secondary | ICD-10-CM | POA: Diagnosis present

## 2011-02-28 DIAGNOSIS — R197 Diarrhea, unspecified: Secondary | ICD-10-CM | POA: Diagnosis present

## 2011-02-28 DIAGNOSIS — M129 Arthropathy, unspecified: Secondary | ICD-10-CM | POA: Diagnosis present

## 2011-02-28 DIAGNOSIS — Z860100 Personal history of colon polyps, unspecified: Secondary | ICD-10-CM

## 2011-02-28 DIAGNOSIS — B952 Enterococcus as the cause of diseases classified elsewhere: Secondary | ICD-10-CM | POA: Diagnosis present

## 2011-02-28 DIAGNOSIS — G43909 Migraine, unspecified, not intractable, without status migrainosus: Secondary | ICD-10-CM

## 2011-02-28 DIAGNOSIS — E876 Hypokalemia: Secondary | ICD-10-CM

## 2011-02-28 DIAGNOSIS — E871 Hypo-osmolality and hyponatremia: Principal | ICD-10-CM | POA: Diagnosis present

## 2011-02-28 DIAGNOSIS — Z888 Allergy status to other drugs, medicaments and biological substances status: Secondary | ICD-10-CM

## 2011-02-28 DIAGNOSIS — R5383 Other fatigue: Secondary | ICD-10-CM | POA: Diagnosis present

## 2011-02-28 DIAGNOSIS — Z9849 Cataract extraction status, unspecified eye: Secondary | ICD-10-CM

## 2011-02-28 DIAGNOSIS — F411 Generalized anxiety disorder: Secondary | ICD-10-CM | POA: Diagnosis present

## 2011-02-28 DIAGNOSIS — F1020 Alcohol dependence, uncomplicated: Secondary | ICD-10-CM | POA: Diagnosis present

## 2011-02-28 HISTORY — DX: Unspecified cirrhosis of liver: K74.60

## 2011-02-28 HISTORY — DX: Heart failure, unspecified: I50.9

## 2011-02-28 HISTORY — DX: Unspecified osteoarthritis, unspecified site: M19.90

## 2011-02-28 HISTORY — DX: Gastro-esophageal reflux disease without esophagitis: K21.9

## 2011-02-28 HISTORY — DX: Chronic kidney disease, unspecified: N18.9

## 2011-02-28 HISTORY — DX: Chronic obstructive pulmonary disease, unspecified: J44.9

## 2011-02-28 HISTORY — DX: Migraine, unspecified, not intractable, without status migrainosus: G43.909

## 2011-02-28 HISTORY — DX: Anxiety disorder, unspecified: F41.9

## 2011-02-28 LAB — CBC
HCT: 29.3 % — ABNORMAL LOW (ref 36.0–46.0)
Hemoglobin: 10.5 g/dL — ABNORMAL LOW (ref 12.0–15.0)
MCH: 34.8 pg — ABNORMAL HIGH (ref 26.0–34.0)
MCHC: 35.8 g/dL (ref 30.0–36.0)
MCV: 97 fL (ref 78.0–100.0)
Platelets: 113 10*3/uL — ABNORMAL LOW (ref 150–400)
RBC: 3.02 MIL/uL — ABNORMAL LOW (ref 3.87–5.11)
RDW: 13.3 % (ref 11.5–15.5)
WBC: 5.1 10*3/uL (ref 4.0–10.5)

## 2011-02-28 LAB — URINALYSIS, ROUTINE W REFLEX MICROSCOPIC
Bilirubin Urine: NEGATIVE
Glucose, UA: NEGATIVE mg/dL
Ketones, ur: NEGATIVE mg/dL
Nitrite: NEGATIVE
Protein, ur: NEGATIVE mg/dL
Specific Gravity, Urine: 1.005 (ref 1.005–1.030)
Urobilinogen, UA: 4 mg/dL — ABNORMAL HIGH (ref 0.0–1.0)
pH: 6 (ref 5.0–8.0)

## 2011-02-28 LAB — DIFFERENTIAL
Basophils Absolute: 0 10*3/uL (ref 0.0–0.1)
Basophils Relative: 0 % (ref 0–1)
Eosinophils Absolute: 0 10*3/uL (ref 0.0–0.7)
Eosinophils Relative: 1 % (ref 0–5)
Lymphocytes Relative: 23 % (ref 12–46)
Lymphs Abs: 1.2 10*3/uL (ref 0.7–4.0)
Monocytes Absolute: 0.7 10*3/uL (ref 0.1–1.0)
Monocytes Relative: 13 % — ABNORMAL HIGH (ref 3–12)
Neutro Abs: 3.2 10*3/uL (ref 1.7–7.7)
Neutrophils Relative %: 63 % (ref 43–77)

## 2011-02-28 LAB — COMPREHENSIVE METABOLIC PANEL
ALT: 22 U/L (ref 0–35)
AST: 62 U/L — ABNORMAL HIGH (ref 0–37)
Albumin: 2.2 g/dL — ABNORMAL LOW (ref 3.5–5.2)
Alkaline Phosphatase: 165 U/L — ABNORMAL HIGH (ref 39–117)
BUN: 5 mg/dL — ABNORMAL LOW (ref 6–23)
CO2: 31 mEq/L (ref 19–32)
Calcium: 8.4 mg/dL (ref 8.4–10.5)
Chloride: 81 mEq/L — ABNORMAL LOW (ref 96–112)
Creatinine, Ser: 0.97 mg/dL (ref 0.50–1.10)
GFR calc Af Amer: 72 mL/min — ABNORMAL LOW (ref >90)
GFR calc non Af Amer: 62 mL/min — ABNORMAL LOW (ref >90)
Glucose, Bld: 99 mg/dL (ref 70–99)
Potassium: 3.2 mEq/L — ABNORMAL LOW (ref 3.5–5.1)
Sodium: 121 mEq/L — ABNORMAL LOW (ref 135–145)
Total Bilirubin: 2.8 mg/dL — ABNORMAL HIGH (ref 0.3–1.2)
Total Protein: 6.4 g/dL (ref 6.0–8.3)

## 2011-02-28 LAB — URINE MICROSCOPIC-ADD ON

## 2011-02-28 LAB — SODIUM, URINE, RANDOM: Sodium, Ur: 22 mEq/L

## 2011-02-28 MED ORDER — ADULT MULTIVITAMIN W/MINERALS CH
1.0000 | ORAL_TABLET | Freq: Every day | ORAL | Status: DC
  Administered 2011-02-28 – 2011-03-04 (×5): 1 via ORAL
  Filled 2011-02-28 (×5): qty 1

## 2011-02-28 MED ORDER — SPIRONOLACTONE 25 MG PO TABS: 25.0000 mg | ORAL_TABLET | Freq: Every day | ORAL | Status: DC

## 2011-02-28 MED ORDER — SODIUM CHLORIDE 0.9 % IV SOLN
Freq: Once | INTRAVENOUS | Status: AC
  Administered 2011-02-28: 14:00:00 via INTRAVENOUS

## 2011-02-28 MED ORDER — PROPRANOLOL HCL 10 MG PO TABS
10.0000 mg | ORAL_TABLET | Freq: Every day | ORAL | Status: DC
  Administered 2011-02-28 – 2011-03-01 (×2): 10 mg via ORAL
  Filled 2011-02-28 (×2): qty 1

## 2011-02-28 MED ORDER — NICOTINE 21 MG/24HR TD PT24
21.0000 mg | MEDICATED_PATCH | Freq: Every day | TRANSDERMAL | Status: DC
  Administered 2011-02-28 – 2011-03-03 (×4): 21 mg via TRANSDERMAL
  Filled 2011-02-28 (×5): qty 1

## 2011-02-28 MED ORDER — IPRATROPIUM BROMIDE 0.02 % IN SOLN
0.5000 mg | Freq: Once | RESPIRATORY_TRACT | Status: AC
  Administered 2011-02-28: 0.5 mg via RESPIRATORY_TRACT
  Filled 2011-02-28: qty 2.5

## 2011-02-28 MED ORDER — DEXTROSE 5 % IV SOLN
1.0000 g | INTRAVENOUS | Status: DC
  Administered 2011-03-01 – 2011-03-03 (×3): 1 g via INTRAVENOUS
  Filled 2011-02-28 (×5): qty 10

## 2011-02-28 MED ORDER — FOLIC ACID 1 MG PO TABS
1.0000 mg | ORAL_TABLET | Freq: Every day | ORAL | Status: DC
  Administered 2011-02-28 – 2011-03-04 (×5): 1 mg via ORAL
  Filled 2011-02-28 (×5): qty 1

## 2011-02-28 MED ORDER — ONDANSETRON HCL 4 MG PO TABS: 4.0000 mg | ORAL_TABLET | Freq: Four times a day (QID) | ORAL | Status: DC | PRN

## 2011-02-28 MED ORDER — TRAMADOL HCL 50 MG PO TABS
50.0000 mg | ORAL_TABLET | Freq: Three times a day (TID) | ORAL | Status: DC
  Administered 2011-02-28 – 2011-03-04 (×12): 50 mg via ORAL
  Filled 2011-02-28 (×13): qty 1

## 2011-02-28 MED ORDER — VITAMIN B-1 100 MG PO TABS
100.0000 mg | ORAL_TABLET | Freq: Every day | ORAL | Status: DC
  Administered 2011-02-28 – 2011-03-04 (×5): 100 mg via ORAL
  Filled 2011-02-28 (×5): qty 1

## 2011-02-28 MED ORDER — ACETAMINOPHEN 650 MG RE SUPP: 650.0000 mg | Freq: Four times a day (QID) | RECTAL | Status: DC | PRN

## 2011-02-28 MED ORDER — DEXTROSE 5 % IV SOLN
1.0000 g | Freq: Once | INTRAVENOUS | Status: AC
  Administered 2011-02-28: 1 g via INTRAVENOUS
  Filled 2011-02-28: qty 10

## 2011-02-28 MED ORDER — ACETAMINOPHEN 325 MG PO TABS: 650.0000 mg | ORAL_TABLET | Freq: Four times a day (QID) | ORAL | Status: DC | PRN

## 2011-02-28 MED ORDER — PANTOPRAZOLE SODIUM 40 MG PO TBEC
80.0000 mg | DELAYED_RELEASE_TABLET | Freq: Every day | ORAL | Status: DC
  Administered 2011-02-28 – 2011-03-04 (×5): 80 mg via ORAL
  Filled 2011-02-28 (×2): qty 2
  Filled 2011-02-28 (×5): qty 1
  Filled 2011-02-28: qty 2

## 2011-02-28 MED ORDER — POTASSIUM CHLORIDE CRYS ER 20 MEQ PO TBCR
40.0000 meq | EXTENDED_RELEASE_TABLET | ORAL | Status: AC
  Administered 2011-02-28 (×2): 40 meq via ORAL
  Filled 2011-02-28 (×2): qty 2

## 2011-02-28 MED ORDER — SODIUM CHLORIDE 0.9 % IV BOLUS (SEPSIS)
1000.0000 mL | Freq: Once | INTRAVENOUS | Status: AC
  Administered 2011-02-28: 1000 mL via INTRAVENOUS

## 2011-02-28 MED ORDER — ALPRAZOLAM 0.5 MG PO TABS
0.5000 mg | ORAL_TABLET | Freq: Three times a day (TID) | ORAL | Status: DC
  Administered 2011-02-28 – 2011-03-04 (×13): 0.5 mg via ORAL
  Filled 2011-02-28 (×13): qty 1

## 2011-02-28 MED ORDER — SODIUM CHLORIDE 0.9 % IV SOLN: INTRAVENOUS | Status: DC

## 2011-02-28 MED ORDER — ALBUTEROL SULFATE (5 MG/ML) 0.5% IN NEBU
5.0000 mg | INHALATION_SOLUTION | Freq: Once | RESPIRATORY_TRACT | Status: AC
  Administered 2011-02-28: 5 mg via RESPIRATORY_TRACT
  Filled 2011-02-28: qty 1

## 2011-02-28 MED ORDER — ONDANSETRON HCL 4 MG/2ML IJ SOLN: 4.0000 mg | Freq: Four times a day (QID) | INTRAMUSCULAR | Status: DC | PRN

## 2011-02-28 NOTE — ED Notes (Signed)
Lab at bedside

## 2011-02-28 NOTE — ED Provider Notes (Signed)
Medical screening examination/treatment/procedure(s) were performed by non-physician practitioner and as supervising physician I was immediately available for consultation/collaboration.  Flint Melter, MD 02/28/11 [phone]

## 2011-02-28 NOTE — ED Notes (Signed)
Urine sent as Add On

## 2011-02-28 NOTE — H&P (Signed)
PCP:  KAPLAN,KRISTEN, PA, PA-C   DOA:  02/28/2011  8:54 AM  Chief Complaint:  Generalized weakness  HPI: This is a 61 years old woman with history of liver cirrhosis and other medical problems, she was seen by her PCP recently for generalized weakness, falls and had blood work done that apparently showed hyponatremia. Patient was called by her PCP last night and was advised to go to the ER. Patient came to the ER today her main complains with generalized weakness and falls with imbalance gait. She stated that she has been having diarrhea for 3 years she described it as watery nonbloody about 5 times a day. As per her had a colonoscopy done 5 years ago that showed 2 polyps which were removed otherwise was told normal. She have a poor appetite and nausea chronically however no vomiting. She denies any dysuria, flanks pain, fever or chills.  in the ED her labs showed hyponatremia with sodium level of 121 and hypokalemia with potassium level of 3.2 and we were asked to admit for further evaluation. Allergies: Allergies  Allergen Reactions  . Aspirin Rash  . Cortisone Rash and Other (See Comments)    Red spots.    Prior to Admission medications   Medication Sig Start Date End Date Taking? Authorizing Provider  ALPRAZolam Prudy Feeler) 0.5 MG tablet Take 0.5 mg by mouth 3 (three) times daily.     Yes Historical Provider, MD  esomeprazole (NEXIUM) 40 MG capsule Take 40 mg by mouth daily before breakfast.     Yes Historical Provider, MD  furosemide (LASIX) 20 MG tablet Take 20 mg by mouth daily.     Yes Historical Provider, MD  propranolol (INDERAL) 10 MG tablet Take 10 mg by mouth daily.     Yes Historical Provider, MD  spironolactone (ALDACTONE) 25 MG tablet Take 25 mg by mouth daily.     Yes Historical Provider, MD  traMADol (ULTRAM) 50 MG tablet Take 50 mg by mouth 3 (three) times daily. Maximum dose= 8 tablets per day    Yes Historical Provider, MD    Past Medical History  Diagnosis Date      .  Cirrhosis   . COPD (chronic obstructive pulmonary disease)   . Chronic kidney disease     "from cirrhosis of the liver"  . Anxiety   . Arthritis   . GERD (gastroesophageal reflux disease)   . Asthma   . Migraines 02/28/11    "I have bad migraines"    Past Surgical History  Procedure Date  . Cataract extraction w/ intraocular lens  implant, bilateral 2011  . Appendectomy 1965    Social History:  she lives with her son, drinks alcohol heavily, she used to drink a case of beer a day for the last 3 years, currently drinks 5 beers a day as per her. She smokes cigarettes for about 45 years, 15 cigarettes a day. She denies any use of illicit drugs.  family history Father with coronary disease , denies family history of liver disease. Review of Systems:  Constitutional: Denies fever, chills, diaphoresis,  C/oappetite change and fatigue.    Respiratory: c/oDOE, denies cough, chest tightness,  and wheezing.   Cardiovascular: Denies chest pain, palpitations and leg swelling.  Gastrointestinal: c/onausea and diarrhea, denies  vomiting, abdominal pain, constipation, blood in stool and abdominal distention.  Genitourinary: Denies dysuria, urgency, frequency, hematuria, flank pain and difficulty urinating.   Neurological: Denies dizziness, seizures, syncope, weakness, light-headedness, numbness and headaches.    Physical  Exam:  Filed Vitals:   02/28/11 1128 02/28/11 1324 02/28/11 1404 02/28/11 1501  BP: 123/60 94/58 120/58 119/55  Pulse: 62 59 62 63  Temp: 98.1 F (36.7 C)  98.4 F (36.9 C)   TempSrc: Oral  Oral   Resp: 14  18   SpO2:  97% 99% 99%    Constitutional: Vital signs reviewed.  Patient is a  in no acute distress and cooperative with exam. Alert and oriented x3.  Head: Normocephalic and atraumatic Neck: Supple, Trachea midline normal ROM, No JVD,  Cardiovascular: RRR, S1 normal, S2 normal, no MRG, pulses symmetric and intact bilaterally Pulmonary/Chest: CTAB, no wheezes,  rales, or rhonchi Abdominal: Soft. Non-tender, mildly distended, bowel sounds are normal, no masses, organomegaly, or guarding present.  GU: no CVA tenderness Ext: no edema and no cyanosis, pulses palpable bilaterally Neurological: A&O x3, Strenght is normal and symmetric bilaterally, cranial nerve II-XII are grossly intact, no focal motor deficit, sensory intact to light touch bilaterally.    Labs on Admission:  Results for orders placed during the hospital encounter of 02/28/11 (from the past 48 hour(s))  CBC     Status: Abnormal   Collection Time   02/28/11 10:24 AM      Component Value Range Comment   WBC 5.1  4.0 - 10.5 (K/uL)    RBC 3.02 (*) 3.87 - 5.11 (MIL/uL)    Hemoglobin 10.5 (*) 12.0 - 15.0 (g/dL)    HCT 14.7 (*) 82.9 - 46.0 (%)    MCV 97.0  78.0 - 100.0 (fL)    MCH 34.8 (*) 26.0 - 34.0 (pg)    MCHC 35.8  30.0 - 36.0 (g/dL)    RDW 56.2  13.0 - 86.5 (%)    Platelets 113 (*) 150 - 400 (K/uL) PLATELET COUNT CONFIRMED BY SMEAR  DIFFERENTIAL     Status: Abnormal   Collection Time   02/28/11 10:24 AM      Component Value Range Comment   Neutrophils Relative 63  43 - 77 (%)    Neutro Abs 3.2  1.7 - 7.7 (K/uL)    Lymphocytes Relative 23  12 - 46 (%)    Lymphs Abs 1.2  0.7 - 4.0 (K/uL)    Monocytes Relative 13 (*) 3 - 12 (%)    Monocytes Absolute 0.7  0.1 - 1.0 (K/uL)    Eosinophils Relative 1  0 - 5 (%)    Eosinophils Absolute 0.0  0.0 - 0.7 (K/uL)    Basophils Relative 0  0 - 1 (%)    Basophils Absolute 0.0  0.0 - 0.1 (K/uL)   COMPREHENSIVE METABOLIC PANEL     Status: Abnormal   Collection Time   02/28/11 10:24 AM      Component Value Range Comment   Sodium 121 (*) 135 - 145 (mEq/L)    Potassium 3.2 (*) 3.5 - 5.1 (mEq/L)    Chloride 81 (*) 96 - 112 (mEq/L)    CO2 31  19 - 32 (mEq/L)    Glucose, Bld 99  70 - 99 (mg/dL)    BUN 5 (*) 6 - 23 (mg/dL)    Creatinine, Ser 7.84  0.50 - 1.10 (mg/dL)    Calcium 8.4  8.4 - 10.5 (mg/dL)    Total Protein 6.4  6.0 - 8.3 (g/dL)     Albumin 2.2 (*) 3.5 - 5.2 (g/dL)    AST 62 (*) 0 - 37 (U/L)    ALT 22  0 - 35 (U/L)  Alkaline Phosphatase 165 (*) 39 - 117 (U/L)    Total Bilirubin 2.8 (*) 0.3 - 1.2 (mg/dL)    GFR calc non Af Amer 62 (*) >90 (mL/min)    GFR calc Af Amer 72 (*) >90 (mL/min)   URINALYSIS, ROUTINE W REFLEX MICROSCOPIC     Status: Abnormal   Collection Time   02/28/11 11:27 AM      Component Value Range Comment   Color, Urine AMBER (*) YELLOW  BIOCHEMICALS MAY BE AFFECTED BY COLOR   APPearance CLOUDY (*) CLEAR     Specific Gravity, Urine 1.005  1.005 - 1.030     pH 6.0  5.0 - 8.0     Glucose, UA NEGATIVE  NEGATIVE (mg/dL)    Hgb urine dipstick TRACE (*) NEGATIVE     Bilirubin Urine NEGATIVE  NEGATIVE     Ketones, ur NEGATIVE  NEGATIVE (mg/dL)    Protein, ur NEGATIVE  NEGATIVE (mg/dL)    Urobilinogen, UA 4.0 (*) 0.0 - 1.0 (mg/dL)    Nitrite NEGATIVE  NEGATIVE     Leukocytes, UA LARGE (*) NEGATIVE    URINE MICROSCOPIC-ADD ON     Status: Abnormal   Collection Time   02/28/11 11:27 AM      Component Value Range Comment   Squamous Epithelial / LPF FEW (*) RARE     WBC, UA 21-50  <3 (WBC/hpf)    RBC / HPF 3-6  <3 (RBC/hpf)    Bacteria, UA MANY (*) RARE      Radiological Exams on Admission: No results found.  Assessment/Plan Principal Problem:  *Hyponatremia Active Problems:  Diarrhea  Liver cirrhosis  UTI (lower urinary tract infection)  Weakness generalized Plan: Her hyponatremia is probably multifactorial, could be secondary to dehydration and /or liver disease induced ADH secretion. She was started on normal saline in the ER. I will send urine sodium(probably not very helpful since she was on Lasix), urine osmolality and serum osmolality. Also check TSH and cortisol level. Would hold diuretics for now.  clinically seems to be dehydrated, so I agree with normal saline for now pending studies results. Check sodium level every 4 hours to avoid overcorrection, goal is not to increase by more than  10 meq in  24 hours. Obviously if her repeat sodium is lower with IV fluid that would indicate SIADH and at that time we will need to stop IV fluid and restrict her free water intake. We'll also replete potassium, that should also help with correction of hyponatremia. Check magnesium level. For  chronic diarrhea, I will check HIV status, send stool studies.  Continue Rocephin for UTI and follow urine culture. Placeon CIWA protocol , watch closely for withdrawal symptoms and order folic acid, thiamine and multivitamins Nicotine patch, and  consult tobacco cessationcounselor Social worker consult for alcohol abuse PT consult She is full code  Time Spent on Admission: 50 minutes  Latrina Guttman 02/28/2011, 4:31 PM

## 2011-02-28 NOTE — Progress Notes (Signed)
Pt came from ED at 1645 this evening. She had seen her PCP the day before and had blood work done. MD called her at home last night and told her to go to ED with Na of 121. Pt also has a UTI. Alert and Oriented x 4, full code, from home with son. Uses cane for ambulation and is a high fall risk. On telemetry running SB. On contact to r/o CDiff. Pt is jaundiced and has bruise on left hip from a fall at home the week before. Bilateral forearm bruises with dry, flaky skin. Hx of COPD, CHF and cirrhosis. Peter Congo RN

## 2011-02-28 NOTE — Progress Notes (Signed)
PHARMACIST - PHYSICIAN COMMUNICATION DR:   Katheran James. CONCERNING:  Pt with Cirrhosis on Ultram 50mg  tid   RECOMMENDATION: Decrease Ultram to bid

## 2011-02-28 NOTE — ED Provider Notes (Signed)
History     CSN: [id number]  Arrival date & time 02/28/11  4098   First MD Initiated Contact with Patient 02/28/11 [phone]      Chief Complaint  Patient presents with  . Dizziness  . Headache  . Emesis    (Consider location/radiation/quality/duration/timing/severity/associated sxs/prior treatment) HPI Patient presents the emergency department with a three-week history of weakness vomiting headache and diarrhea intermittently.  Patient states she has been seen by her family doctor and sent here because her sodium was low for admission.  Patient states she has not had any fevers, abdominal pain, visual disturbance, chest pain or shortness of breath.  Since she is still a smoker and drinks daily he still.  Patient has a history of cirrhosis CHF and COPD.  Past Medical History  Diagnosis Date  . CHF (congestive heart failure)   . Cirrhosis   . COPD (chronic obstructive pulmonary disease)     History reviewed. No pertinent past surgical history.  No family history on file.  History  Substance Use Topics  . Smoking status: Current Everyday Smoker  . Smokeless tobacco: Not on file  . Alcohol Use: Yes     daily--5 beers per day    OB History    Grav Para Term Preterm Abortions TAB SAB Ect Mult Living                  Review of Systems All pertinent positives and negatives reviewed in the history of present illness  Allergies  Aspirin and Cortisone  Home Medications   Current Outpatient Rx  Name Route Sig Dispense Refill  . ALPRAZOLAM 0.5 MG PO TABS Oral Take 0.5 mg by mouth 3 (three) times daily.      Marland Kitchen ESOMEPRAZOLE MAGNESIUM 40 MG PO CPDR Oral Take 40 mg by mouth daily before breakfast.      . FUROSEMIDE 20 MG PO TABS Oral Take 20 mg by mouth daily.      Marland Kitchen PROPRANOLOL HCL 10 MG PO TABS Oral Take 10 mg by mouth daily.      Marland Kitchen SPIRONOLACTONE 25 MG PO TABS Oral Take 25 mg by mouth daily.      . TRAMADOL HCL 50 MG PO TABS Oral Take 50 mg by mouth 3 (three) times daily.  Maximum dose= 8 tablets per day       BP 120/58  Pulse 62  Temp(Src) 98.4 F (36.9 C) (Oral)  Resp 18  SpO2 99%  Physical Exam  Constitutional: She is oriented to person, place, and time. She appears well-developed and well-nourished. No distress.  HENT:  Head: Normocephalic and atraumatic.  Eyes: Pupils are equal, round, and reactive to light. Right eye exhibits no discharge. Left eye exhibits no discharge.  Neck: Normal range of motion. Neck supple.  Cardiovascular: Normal rate and regular rhythm.  Exam reveals no gallop and no friction rub.   No murmur heard. Pulmonary/Chest: Effort normal. No respiratory distress. She has wheezes. She has no rales.  Neurological: She is alert and oriented to person, place, and time. Coordination normal.  Skin: Skin is warm and dry. No rash noted. No erythema.    ED Course  Procedures (including critical care time)  Labs Reviewed  CBC - Abnormal; Notable for the following:    RBC 3.02 (*)    Hemoglobin 10.5 (*)    HCT 29.3 (*)    MCH 34.8 (*)    Platelets 113 (*) PLATELET COUNT CONFIRMED BY SMEAR   All other components within  normal limits  DIFFERENTIAL - Abnormal; Notable for the following:    Monocytes Relative 13 (*)    All other components within normal limits  COMPREHENSIVE METABOLIC PANEL - Abnormal; Notable for the following:    Sodium 121 (*)    Potassium 3.2 (*)    Chloride 81 (*)    BUN 5 (*)    Albumin 2.2 (*)    AST 62 (*)    Alkaline Phosphatase 165 (*)    Total Bilirubin 2.8 (*)    GFR calc non Af Amer 62 (*)    GFR calc Af Amer 72 (*)    All other components within normal limits  URINALYSIS, ROUTINE W REFLEX MICROSCOPIC - Abnormal; Notable for the following:    Color, Urine AMBER (*) BIOCHEMICALS MAY BE AFFECTED BY COLOR   APPearance CLOUDY (*)    Hgb urine dipstick TRACE (*)    Urobilinogen, UA 4.0 (*)    Leukocytes, UA LARGE (*)    All other components within normal limits  URINE MICROSCOPIC-ADD ON -  Abnormal; Notable for the following:    Squamous Epithelial / LPF FEW (*)    Bacteria, UA MANY (*)    All other components within normal limits  URINE CULTURE   I feel the patient will need to be admitted to the hospital for UTI weakness hyponatremia hypokalemia.    Awaiting return phone call from the [facility]ad hospitalist is on for unassigned medicine today.    MDM  MDM Reviewed: nursing note and vitals Reviewed previous: labs, ECG and x-ray Interpretation: labs, ECG and x-ray Consults: admitting MD      I spoke with GI about the patient and they will come and see the patient in consult for the hospitalist.      Carlyle Dolly, PA-C 02/28/11 1657  Carlyle Dolly, PA-C 02/28/11 1657

## 2011-02-28 NOTE — ED Notes (Signed)
Patient transported to X-ray 

## 2011-02-28 NOTE — ED Notes (Signed)
[facility] sent patient here with extremely low sodium.  Pt sts her sodium level is "10".  Pt is alert and talking.  Pt reports her balance is off.  Pt has not felt well for 3 weeks.  Pt has had vomiting and headache.  Diarrhea intermittently.

## 2011-03-01 LAB — COMPREHENSIVE METABOLIC PANEL
BUN: 7 mg/dL (ref 6–23)
CO2: 29 mEq/L (ref 19–32)
Calcium: 8.3 mg/dL — ABNORMAL LOW (ref 8.4–10.5)
Chloride: 91 mEq/L — ABNORMAL LOW (ref 96–112)
Creatinine, Ser: 1.03 mg/dL (ref 0.50–1.10)
GFR calc Af Amer: 67 mL/min — ABNORMAL LOW (ref >90)
GFR calc non Af Amer: 57 mL/min — ABNORMAL LOW (ref >90)
Glucose, Bld: 109 mg/dL — ABNORMAL HIGH (ref 70–99)
Total Bilirubin: 1.8 mg/dL — ABNORMAL HIGH (ref 0.3–1.2)

## 2011-03-01 LAB — CLOSTRIDIUM DIFFICILE BY PCR: Toxigenic C. Difficile by PCR: NEGATIVE

## 2011-03-01 LAB — CORTISOL: Cortisol, Plasma: 4.8 ug/dL

## 2011-03-01 LAB — CBC
MCH: 35.2 pg — ABNORMAL HIGH (ref 26.0–34.0)
Platelets: 103 10*3/uL — ABNORMAL LOW (ref 150–400)
RBC: 2.61 MIL/uL — ABNORMAL LOW (ref 3.87–5.11)
WBC: 5.4 10*3/uL (ref 4.0–10.5)

## 2011-03-01 LAB — PROTIME-INR
INR: 1.31 (ref 0.00–1.49)
Prothrombin Time: 16.5 seconds — ABNORMAL HIGH (ref 11.6–15.2)

## 2011-03-01 MED ORDER — ALBUTEROL SULFATE HFA 108 (90 BASE) MCG/ACT IN AERS
2.0000 | INHALATION_SPRAY | Freq: Four times a day (QID) | RESPIRATORY_TRACT | Status: DC | PRN
  Administered 2011-03-02: 2 via RESPIRATORY_TRACT
  Filled 2011-03-01: qty 6.7

## 2011-03-01 MED ORDER — PROPRANOLOL HCL 10 MG PO TABS
10.0000 mg | ORAL_TABLET | Freq: Every day | ORAL | Status: DC
  Administered 2011-03-02 – 2011-03-04 (×3): 10 mg via ORAL
  Filled 2011-03-01 (×3): qty 1

## 2011-03-01 NOTE — Plan of Care (Signed)
Problem: Food- and Nutrition-Related Knowledge Deficit (NB-1.1) Goal: Nutrition education Formal process to instruct or train a patient/client in a skill or to impart knowledge to help patients/clients voluntarily manage or modify food choices and eating behavior to maintain or improve health.  Outcome: Completed/Met Date Met:  03/01/11 RD was consulted for diet education regarding hyponatremia and alcohol related liver disease. Discussed importance of moderate sodium consumption and safe food practices. Handouts provided from [employer]. Questions answered. Expect fair to poor compliance. No nutrition problems identified PTA, no further intervention at this time.   Clarene Duke MARIE

## 2011-03-01 NOTE — Progress Notes (Signed)
Utilization Review Completed.Yuuki Skeens T1/03/2011   

## 2011-03-01 NOTE — Progress Notes (Signed)
Patient ID: Abigail Crawford, female   DOB: 22-Dec-1950, 61 y.o.   MRN: [id number] Subjective: I drink 5 beers a day and eat very little.  I've had increasing dizziness over the last month.  Objective: Weight change:   Intake/Output Summary (Last 24 hours) at 03/01/11 1025 Last data filed at 03/01/11 0600  Gross per 24 hour  Intake    720 ml  Output    101 ml  Net    619 ml   Blood pressure 88/53, pulse 66, temperature 98.3 F (36.8 C), temperature source Oral, resp. rate 18, height 5\' 3"  (1.6 m), weight 58 kg (127 lb 13.9 oz), SpO2 100.00%. Filed Vitals:   02/28/11 1752 02/28/11 1754 02/28/11 2151 03/01/11 0536  BP: 121/75 103/68 93/55 88/53   Pulse: 63 76 68 66  Temp:   98.1 F (36.7 C) 98.3 F (36.8 C)  TempSrc:   Oral Oral  Resp:   20 18  Height:      Weight:    58 kg (127 lb 13.9 oz)  SpO2:   96% 100%    Physical Exam: General: No acute distress Lungs: Clear to auscultation bilaterally without wheezes or crackles Cardiovascular: Regular rate and rhythm without murmur gallop or rub normal S1 and S2 Abdomen: Nontender, nondistended, soft, bowel sounds positive, no rebound, no ascites, no appreciable mass Extremities: No significant cyanosis, clubbing, or edema bilateral lower extremities  Basic Metabolic Panel:  Lab 03/01/11 7829 03/01/11 0420 02/28/11 1024  NA 126* 124* --  K -- 4.4 3.2*  CL -- 91* 81*  CO2 -- 29 31  GLUCOSE -- 109* 99  BUN -- 7 5*  CREATININE -- 1.03 0.97  CALCIUM -- 8.3* 8.4  MG -- -- --  PHOS -- -- --   Liver Function Tests:  Lab 03/01/11 0420 02/28/11 1024  AST 58* 62*  ALT 18 22  ALKPHOS 151* 165*  BILITOT 1.8* 2.8*  PROT 5.6* 6.4  ALBUMIN 1.9* 2.2*   CBC:  Lab 03/01/11 0610 02/28/11 1024  WBC 5.4 5.1  NEUTROABS -- 3.2  HGB 9.2* 10.5*  HCT 26.4* 29.3*  MCV 101.1* 97.0  PLT 103* 113*   Coagulation:  Lab 03/01/11 0420  LABPROT 16.5*  INR 1.31   Urinalysis: Results for Abigail, Crawford (MRN [id number]) as of 03/01/2011  10:14  Ref. Range 02/28/2011 11:27  Color, Urine Latest Range: YELLOW  AMBER (A)  APPearance Latest Range: CLEAR  CLOUDY (A)  Specific Gravity, Urine Latest Range: 1.005-1.030  1.005  pH Latest Range: 5.0-8.0  6.0  Glucose, UA Latest Range: NEGATIVE mg/dL NEGATIVE  Bilirubin Urine Latest Range: NEGATIVE  NEGATIVE  Ketones, ur Latest Range: NEGATIVE mg/dL NEGATIVE  Protein Latest Range: NEGATIVE mg/dL NEGATIVE  Urobilinogen, UA Latest Range: 0.0-1.0 mg/dL 4.0 (H)  Nitrite Latest Range: NEGATIVE  NEGATIVE  Leukocytes, UA Latest Range: NEGATIVE  LARGE (A)  WBC, UA Latest Range: <3 WBC/hpf 21-50  RBC / HPF Latest Range: <3 RBC/hpf 3-6  Squamous Epithelial / LPF Latest Range: RARE  FEW (A)  Bacteria, UA Latest Range: RARE  MANY (A)    Micro Results: Recent Results (from the past 240 hour(s))  CLOSTRIDIUM DIFFICILE BY PCR     Status: Normal   Collection Time   03/01/11 12:31 AM      Component Value Range Status Comment   C difficile by pcr NEGATIVE  NEGATIVE  Final     Studies/Results: Scheduled Meds:   . sodium chloride   Intravenous Once  .  albuterol  5 mg Nebulization Once  . ALPRAZolam  0.5 mg Oral TID  . cefTRIAXone (ROCEPHIN)  IV  1 g Intravenous Once  . cefTRIAXone (ROCEPHIN)  IV  1 g Intravenous Q24H  . folic acid  1 mg Oral Daily  . ipratropium  0.5 mg Nebulization Once  . mulitivitamin with minerals  1 tablet Oral Daily  . nicotine  21 mg Transdermal Daily  . pantoprazole  80 mg Oral Q1200  . potassium chloride  40 mEq Oral Q4H  . propranolol  10 mg Oral Daily  . thiamine  100 mg Oral Daily  . traMADol  50 mg Oral TID  . DISCONTD: propranolol  10 mg Oral Daily  . DISCONTD: spironolactone  25 mg Oral Daily   Continuous Infusions:   . DISCONTD: sodium chloride     PRN Meds:.acetaminophen, acetaminophen, ondansetron (ZOFRAN) IV, ondansetron  Assessment/Plan: Principal Problem:  *Hyponatremia Active Problems:  Diarrhea  Liver cirrhosis  UTI (lower urinary tract  infection)  Weakness generalized   Hyponatremia - likely secondary to potomania from drinking beer.  Patient with alcoholic cirrhosis continues to drink.   Sodium trending up nicely.  Will stop IV fluids as patient is beginning to eat well.     Urine Osmol is very low.  Will restrict fluid intake.  Check daily bmet.  Request nutrition consult for education as well.  Social work consult requested for alcoholism.  Hypokalemia - resolved.   For chronic diarrhea - likely alcohol related.  one bowel movement over night.  c-diff neg.  HIV non-reactive.  UTI.  Continue Rocephin and follow urine culture.   Place on CIWA protocol , watch closely for withdrawal symptoms and order folic acid, thiamine and multivitamins Nicotine patch, and consult tobacco cessation counselor.  Social worker consult for alcohol abuse   PT consult   She is full code   LOS: 1 day   Stephani Police 03/01/2011, 10:25 AM [phone]  Patient seen and examined, agree with above, sodium is improving. Counseled for Alcohol abuse.

## 2011-03-01 NOTE — Progress Notes (Signed)
Physical Therapy Evaluation Patient Details Name: Abigail Crawford MRN: [id number] DOB: April 04, 1950 Today's Date: 03/01/2011  Problem List:  Patient Active Problem List  Diagnoses  . Hyponatremia  . Diarrhea  . Liver cirrhosis  . UTI (lower urinary tract infection)  . Weakness generalized    Past Medical History:  Past Medical History  Diagnosis Date  . CHF (congestive heart failure)   . Cirrhosis   . COPD (chronic obstructive pulmonary disease)   . Chronic kidney disease     "from cirrhosis of the liver"  . Anxiety   . Arthritis   . GERD (gastroesophageal reflux disease)   . Asthma   . Migraines 02/28/11    "I have bad migraines"   Past Surgical History:  Past Surgical History  Procedure Date  . Cataract extraction w/ intraocular lens  implant, bilateral 2011  . Appendectomy 1965    PT Assessment/Plan/Recommendation PT Assessment Clinical Impression Statement: Pt admitted with hyponatremia, chronic diarrhea, and cirrhosis. Pt with generalized weakness and frequent falls along with deficits below. Pt will benefit from therapy to increase strength, transfers, activity and fuctional mobility to decrease burden of care. PT Recommendation/Assessment: Patient will need skilled PT in the acute care venue PT Problem List: Decreased strength;Decreased activity tolerance;Decreased balance;Decreased mobility PT Therapy Diagnosis : Generalized weakness PT Plan PT Frequency: Min 3X/week PT Treatment/Interventions: Gait training;DME instruction;Stair training;Functional mobility training;Therapeutic activities;Therapeutic exercise;Balance training;Patient/family education PT Recommendation Recommendations for Other Services: OT consult Follow Up Recommendations: Home health PT;24 hour supervision/assistance Equipment Recommended: None recommended by PT PT Goals  Acute Rehab PT Goals PT Goal Formulation: With patient/family Time For Goal Achievement: 2 weeks Pt will go Supine/Side  to Sit: with modified independence;with HOB 0 degrees PT Goal: Supine/Side to Sit - Progress: Other (comment) Pt will go Sit to Supine/Side: with modified independence;with HOB 0 degrees PT Goal: Sit to Supine/Side - Progress: Other (comment) Pt will go Sit to Stand: with supervision PT Goal: Sit to Stand - Progress: Other (comment) Pt will go Stand to Sit: with supervision PT Goal: Stand to Sit - Progress: Other (comment) Pt will Transfer Bed to Chair/Chair to Bed: with supervision PT Transfer Goal: Bed to Chair/Chair to Bed - Progress: Other (comment) Pt will Ambulate: 16 - 50 feet;with supervision;with least restrictive assistive device PT Goal: Ambulate - Progress: Other (comment) Pt will Go Up / Down Stairs: 3-5 stairs;with rail(s);with min assist PT Goal: Up/Down Stairs - Progress: Other (comment)  PT Evaluation Precautions/Restrictions  Precautions Precautions: Fall Precaution Comments: pt reports she falls every couple of days Prior Functioning  Home Living Lives With: Son Type of Home: Mobile home Home Layout: One level Home Access: Stairs to enter;Ramped entrance Entrance Stairs-Rails: Right;Left;Can reach both Entrance Stairs-Number of Steps: 5 Bathroom Shower/Tub: Engineer, manufacturing systems: Standard Home Adaptive Equipment: Walker - rolling;Straight cane;Wheelchair - manual Prior Function Level of Independence: Needs assistance with ADLs;Needs assistance with homemaking;Requires assistive device for independence;Independent with transfers Bath: Supervision/set-up (sponge baths) Meal Prep: Maximal Light Housekeeping: Total Driving: No Vocation: Unemployed Financial risk analyst Arousal/Alertness: Awake/alert Overall Cognitive Status: Appears within functional limits for tasks assessed Orientation Level: Disoriented to time;Oriented to person;Oriented to place;Oriented to situation Sensation/Coordination Sensation Light Touch: Appears Intact Extremity  Assessment RLE Assessment RLE Assessment: Exceptions to [facility] RLE AROM (degrees) Overall AROM Right Lower Extremity: Within functional limits for tasks assessed RLE Strength RLE Overall Strength: Deficits (3/5 but unable to resist) LLE Assessment LLE Assessment: Exceptions to [facility] LLE AROM (degrees) Overall AROM Left Lower Extremity:  Within functional limits for tasks assessed LLE Strength LLE Overall Strength: Deficits (3/5 but unable to resist) Mobility (including Balance) Bed Mobility Bed Mobility: Yes Supine to Sit: 4: Min assist;HOB flat Supine to Sit Details (indicate cue type and reason): cueing to sequence and min assist to achieve anterior translation of trunk Transfers Transfers: Yes Sit to Stand: 4: Min assist;From bed;From chair/3-in-1 Sit to Stand Details (indicate cue type and reason): cueing for hand placement Stand to Sit: 4: Min assist;To chair/3-in-1 Stand to Sit Details: cueing for hand placement and sequence Stand Pivot Transfers: 4: Min Actuary Details (indicate cue type and reason): bed to BSC to pt's R, BSC to chair 180 degree pivot with approximately 5 steps to complete. Pt holding onto bil PT arms during transfers. Pt generally shaky with all standing activity and PT blocking bil knees throughout Ambulation/Gait Ambulation/Gait: No Stairs: No  Posture/Postural Control Posture/Postural Control: No significant limitations Balance Balance Assessed: Yes Static Sitting Balance Static Sitting - Balance Support: Feet supported Static Sitting - Level of Assistance: 6: Modified independent (Device/Increase time) Static Sitting - Comment/# of Minutes: 5 Static Standing Balance Static Standing - Level of Assistance: 4: Min assist Exercise  General Exercises - Lower Extremity Long Arc Quad: AROM;20 reps;Both;Seated Hip Flexion/Marching: AROM;10 reps;Both;Seated End of Session PT - End of Session Equipment Utilized During Treatment: Gait  belt Activity Tolerance: Patient tolerated treatment well Patient left: in chair;with call bell in reach;with family/visitor present Nurse Communication: Mobility status for transfers General Behavior During Session: [facility] for tasks performed Cognition: [facility] for tasks performed  Delorse Lek 03/01/2011, 1:10 PM  Toney Sang, PT [phone]

## 2011-03-02 LAB — URINE CULTURE
Colony Count: >=100000
Culture  Setup Time: 201301011403

## 2011-03-02 LAB — FECAL LACTOFERRIN, QUANT: Fecal Lactoferrin: POSITIVE

## 2011-03-02 LAB — COMPREHENSIVE METABOLIC PANEL
ALT: 16 U/L (ref 0–35)
AST: 44 U/L — ABNORMAL HIGH (ref 0–37)
CO2: 28 mEq/L (ref 19–32)
Calcium: 8.4 mg/dL (ref 8.4–10.5)
GFR calc non Af Amer: 55 mL/min — ABNORMAL LOW (ref >90)
Sodium: 130 mEq/L — ABNORMAL LOW (ref 135–145)

## 2011-03-02 LAB — GIARDIA/CRYPTOSPORIDIUM SCREEN(EIA)
Cryptosporidium Screen (EIA): NEGATIVE
Giardia Screen - EIA: NEGATIVE

## 2011-03-02 NOTE — Progress Notes (Signed)
Subjective: Patient seen and examined, sodium has improved.  Objective: Vital signs in last 24 hours: Temp:  [97.3 F (36.3 C)-98 F (36.7 C)] 97.3 F (36.3 C) (01/03 1404) Pulse Rate:  [60-65] 64  (01/03 1404) Resp:  [18-20] 18  (01/03 1404) BP: (86-110)/(53-70) 101/68 mmHg (01/03 1404) SpO2:  [96 %-98 %] 96 % (01/03 1404) Weight:  [58.3 kg (128 lb 8.5 oz)] 128 lb 8.5 oz (58.3 kg) (01/03 0426) Weight change: 0.4 kg (14.1 oz) Last BM Date: 03/01/11  Intake/Output from previous day: 01/02 0701 - 01/03 0700 In: 240 [P.O.:240] Out: -  Total I/O In: 580 [P.O.:480; IV Piggyback:100] Out: 100 [Urine:100]   Physical Exam: General: Alert, awake, oriented x3, in no acute distress. HEENT: No bruits, no goiter. Heart: Regular rate and rhythm, without murmurs, rubs, gallops. Lungs: Clear to auscultation bilaterally. Abdomen: Soft, nontender, nondistended, positive bowel sounds. Extremities: No clubbing cyanosis or edema with positive pedal pulses. Neuro: Grossly intact, nonfocal.    Lab Results: Basic Metabolic Panel:  Basename 03/02/11 0630 03/01/11 0849 03/01/11 0420  NA 130* 126* --  K 4.3 -- 4.4  CL 94* -- 91*  CO2 28 -- 29  GLUCOSE 78 -- 109*  BUN 7 -- 7  CREATININE 1.06 -- 1.03  CALCIUM 8.4 -- 8.3*  MG -- -- --  PHOS -- -- --   Liver Function Tests:  Basename 03/02/11 0630 03/01/11 0420  AST 44* 58*  ALT 16 18  ALKPHOS 127* 151*  BILITOT 1.5* 1.8*  PROT 5.5* 5.6*  ALBUMIN 1.9* 1.9*   CBC:  Basename 03/01/11 0610 02/28/11 1024  WBC 5.4 5.1  NEUTROABS -- 3.2  HGB 9.2* 10.5*  HCT 26.4* 29.3*  MCV 101.1* 97.0  PLT 103* 113*   Coagulation:  Basename 03/01/11 0420  LABPROT 16.5*  INR 1.31    Recent Results (from the past 240 hour(Crawford))  URINE CULTURE     Status: Normal   Collection Time   02/28/11 11:27 AM      Component Value Range Status Comment   Specimen Description URINE, RANDOM   Final    Special Requests NONE   Final    Setup Time  [id number]   Final    Colony Count >=100,000 COLONIES/ML   Final    Culture ENTEROBACTER CLOACAE   Final    Report Status 03/02/2011 FINAL   Final    Organism ID, Bacteria ENTEROBACTER CLOACAE   Final   STOOL CULTURE     Status: Normal (Preliminary result)   Collection Time   03/01/11 12:31 AM      Component Value Range Status Comment   Specimen Description STOOL   Final    Special Requests NONE   Final    Culture Culture reincubated for better growth   Final    Report Status PENDING   Incomplete   CLOSTRIDIUM DIFFICILE BY PCR     Status: Normal   Collection Time   03/01/11 12:31 AM      Component Value Range Status Comment   C difficile by pcr NEGATIVE  NEGATIVE  Final     Studies/Results: No results found.  Medications: Scheduled Meds:   . ALPRAZolam  0.5 mg Oral TID  . cefTRIAXone (ROCEPHIN)  IV  1 g Intravenous Q24H  . folic acid  1 mg Oral Daily  . mulitivitamin with minerals  1 tablet Oral Daily  . nicotine  21 mg Transdermal Daily  . pantoprazole  80 mg Oral Q1200  . propranolol  10 mg Oral Daily  . thiamine  100 mg Oral Daily  . traMADol  50 mg Oral TID   Continuous Infusions:  PRN Meds:.acetaminophen, acetaminophen, albuterol, ondansetron (ZOFRAN) IV, ondansetron    Assessment/Plan: Principal Problem:  *Hyponatremia Active Problems:  Diarrhea  Liver cirrhosis  UTI (lower urinary tract infection)  Weakness generalized   Hyponatremia  Sodium has improved.  UTI On Rocephin Urine culture is growing enterobacter.  Alcohol abuse CIWA protocol. Counseled for alcohol abuse.  Diarrhea C diff is negative Improving.  Liver Cirrhosis Stable.    She is full code   LOS: 2 days   Abigail Crawford 03/02/2011, 5:43 PM [phone]

## 2011-03-03 LAB — BASIC METABOLIC PANEL
BUN: 10 mg/dL (ref 6–23)
GFR calc Af Amer: 61 mL/min — ABNORMAL LOW (ref >90)
GFR calc non Af Amer: 52 mL/min — ABNORMAL LOW (ref >90)
Potassium: 4.1 mEq/L (ref 3.5–5.1)
Sodium: 126 mEq/L — ABNORMAL LOW (ref 135–145)

## 2011-03-03 LAB — COMPREHENSIVE METABOLIC PANEL
Albumin: 1.9 g/dL — ABNORMAL LOW (ref 3.5–5.2)
BUN: 9 mg/dL (ref 6–23)
CO2: 26 mEq/L (ref 19–32)
Chloride: 93 mEq/L — ABNORMAL LOW (ref 96–112)
Creatinine, Ser: 1.12 mg/dL — ABNORMAL HIGH (ref 0.50–1.10)
GFR calc Af Amer: 60 mL/min — ABNORMAL LOW (ref >90)
GFR calc non Af Amer: 52 mL/min — ABNORMAL LOW (ref >90)
Glucose, Bld: 102 mg/dL — ABNORMAL HIGH (ref 70–99)
Total Bilirubin: 1.2 mg/dL (ref 0.3–1.2)

## 2011-03-03 NOTE — Progress Notes (Signed)
Physical Therapy Treatment Patient Details Name: Abigail Crawford MRN: [id number] DOB: 1950-11-30 Today's Date: 03/03/2011  PT Assessment/Plan  PT - Assessment/Plan Comments on Treatment Session: Pt presented to hospital with hyponatremia. Pt now has significant deconditioning and will require up to moderate assistance with all ambulation. Pt's son aware. Pt will have 24 hour assist at the appropriate level per pt and son. Pt refusing SNF. PT recommends pt use 3n1 beside bed until able to ambulate at safer level by HHPT.  PT Plan: Discharge plan remains appropriate;Equipment recommendations need to be updated PT Frequency: Min 3X/week Follow Up Recommendations: Home health PT;24 hour supervision/assistance Equipment Recommended: 3 in 1 bedside comode PT Goals  Acute Rehab PT Goals PT Goal Formulation: With patient/family Time For Goal Achievement: 2 weeks Pt will go Supine/Side to Sit: with modified independence;with HOB 0 degrees PT Goal: Supine/Side to Sit - Progress: Progressing toward goal Pt will go Sit to Supine/Side: with modified independence;with HOB 0 degrees PT Goal: Sit to Supine/Side - Progress: Progressing toward goal Pt will go Sit to Stand: with supervision PT Goal: Sit to Stand - Progress: Progressing toward goal Pt will go Stand to Sit: with supervision PT Goal: Stand to Sit - Progress: Progressing toward goal Pt will Transfer Bed to Chair/Chair to Bed: with supervision PT Transfer Goal: Bed to Chair/Chair to Bed - Progress: Progressing toward goal Pt will Ambulate: 16 - 50 feet;with supervision;with least restrictive assistive device PT Goal: Ambulate - Progress: Progressing toward goal Pt will Go Up / Down Stairs: 3-5 stairs;with rail(s);with min assist PT Goal: Up/Down Stairs - Progress: Progressing toward goal  PT Treatment Precautions/Restrictions  Precautions Precautions: Fall Precaution Comments: Secondary to weakness Restrictions Weight Bearing  Restrictions: No Mobility (including Balance) Bed Mobility Bed Mobility: Yes Supine to Sit: 4: Min assist;HOB flat Supine to Sit Details (indicate cue type and reason): Verbal/tactile cues for sequence, inititation.  Transfers Transfers: Yes Sit to Stand: 4: Min assist;From bed;From chair/3-in-1 Sit to Stand Details (indicate cue type and reason): Verbal cues for UE placement, placement of cane.  Stand to Sit: 4: Min assist;To chair/3-in-1 Stand to Sit Details: Verbal cues to square with chair prior to sitting.  Stand Pivot Transfers: 3: Mod assist Stand Pivot Transfer Details (indicate cue type and reason): Bed to chair then again 3n1 to chair. Pt with posterior lean requiring assist to prevent falling.  Ambulation/Gait Ambulation/Gait: Yes Ambulation/Gait Assistance: 3: Mod assist Ambulation/Gait Assistance Details (indicate cue type and reason): Assist secondary to posterior lean, decreased with increased ambulation.  Ambulation Distance (Feet): 5 Feet Assistive device: Straight cane Gait Pattern: Shuffle;Decreased step length - right;Step-to pattern;Decreased step length - left Stairs: No    Exercise  General Exercises - Lower Extremity Ankle Circles/Pumps: AROM;Both;15 reps;Seated Gluteal Sets: AROM;Both;Other reps (comment);Seated (15 reps) Long Arc Quad: AROM;Both;Seated;15 reps Hip Flexion/Marching: AROM;10 reps;Both;Seated Other Exercises Other Exercises: Hip internal rotation in sitting, both LEs, 5 reps  End of Session PT - End of Session Equipment Utilized During Treatment: Gait belt Activity Tolerance: Patient tolerated treatment well Patient left: in chair;with call bell in reach;with family/visitor present General Behavior During Session: [facility] for tasks performed Cognition: [facility] for tasks performed  Sherie Don 03/03/2011, 1:55 PM  Sherie Don) Carleene Mains PT, DPT Acute Rehabilitation [phone]

## 2011-03-03 NOTE — Progress Notes (Signed)
Subjective: Patient seen and examined, sodium is again low.  Objective: Vital signs in last 24 hours: Temp:  [97.9 F (36.6 C)-98.3 F (36.8 C)] 97.9 F (36.6 C) (01/04 1424) Pulse Rate:  [61-68] 62  (01/04 1424) Resp:  [18-19] 18  (01/04 1424) BP: (95-117)/(53-71) 99/64 mmHg (01/04 1424) SpO2:  [95 %-96 %] 95 % (01/04 1424) Weight:  [61.8 kg (136 lb 3.9 oz)] 136 lb 3.9 oz (61.8 kg) (01/04 0240) Weight change: 3.5 kg (7 lb 11.5 oz) Last BM Date: 03/01/11  Intake/Output from previous day: 01/03 0701 - 01/04 0700 In: 880 [P.O.:780; IV Piggyback:100] Out: 300 [Urine:300]     Physical Exam: General: Alert, awake, oriented x3, in no acute distress. HEENT: No bruits, no goiter. Heart: Regular rate and rhythm, without murmurs, rubs, gallops. Lungs: Clear to auscultation bilaterally. Abdomen: Soft, nontender, nondistended, positive bowel sounds. Extremities: No clubbing cyanosis or edema with positive pedal pulses. Neuro: Grossly intact, nonfocal.    Lab Results: Basic Metabolic Panel:  Basename 03/03/11 1604 03/03/11 0545  NA 126* 126*  K 4.1 4.4  CL 94* 93*  CO2 26 26  GLUCOSE 118* 102*  BUN 10 9  CREATININE 1.11* 1.12*  CALCIUM 8.4 8.5  MG -- --  PHOS -- --   Liver Function Tests:  Basename 03/03/11 0545 03/02/11 0630  AST 44* 44*  ALT 16 16  ALKPHOS 142* 127*  BILITOT 1.2 1.5*  PROT 5.8* 5.5*  ALBUMIN 1.9* 1.9*   CBC:  Basename 03/01/11 0610  WBC 5.4  NEUTROABS --  HGB 9.2*  HCT 26.4*  MCV 101.1*  PLT 103*   Coagulation:  Basename 03/01/11 0420  LABPROT 16.5*  INR 1.31    Recent Results (from the past 240 hour(s))  URINE CULTURE     Status: Normal   Collection Time   02/28/11 11:27 AM      Component Value Range Status Comment   Specimen Description URINE, RANDOM   Final    Special Requests NONE   Final    Setup Time [id number]   Final    Colony Count >=100,000 COLONIES/ML   Final    Culture ENTEROBACTER CLOACAE   Final    Report  Status 03/02/2011 FINAL   Final    Organism ID, Bacteria ENTEROBACTER CLOACAE   Final   STOOL CULTURE     Status: Normal (Preliminary result)   Collection Time   03/01/11 12:31 AM      Component Value Range Status Comment   Specimen Description STOOL   Final    Special Requests NONE   Final    Culture Culture reincubated for better growth   Final    Report Status PENDING   Incomplete   CLOSTRIDIUM DIFFICILE BY PCR     Status: Normal   Collection Time   03/01/11 12:31 AM      Component Value Range Status Comment   C difficile by pcr NEGATIVE  NEGATIVE  Final     Studies/Results: No results found.  Medications: Scheduled Meds:    . ALPRAZolam  0.5 mg Oral TID  . cefTRIAXone (ROCEPHIN)  IV  1 g Intravenous Q24H  . folic acid  1 mg Oral Daily  . mulitivitamin with minerals  1 tablet Oral Daily  . nicotine  21 mg Transdermal Daily  . pantoprazole  80 mg Oral Q1200  . propranolol  10 mg Oral Daily  . thiamine  100 mg Oral Daily  . traMADol  50 mg Oral TID  Continuous Infusions:  PRN Meds:.acetaminophen, acetaminophen, albuterol, ondansetron (ZOFRAN) IV, ondansetron    Assessment/Plan: Principal Problem:  *Hyponatremia Active Problems:  Diarrhea  Liver cirrhosis  UTI (lower urinary tract infection)  Weakness generalized   Hyponatremia  Sodium still low. Cont Fluid restriction  UTI On Rocephin Urine culture is growing enterobacter.  Alcohol abuse CIWA protocol. Counseled for alcohol abuse.  Diarrhea C diff is negative Improving.  Liver Cirrhosis Stable.    She is full code   LOS: 3 days   Abigail Crawford S 03/03/2011, 7:11 PM [phone]

## 2011-03-04 LAB — COMPREHENSIVE METABOLIC PANEL
AST: 43 U/L — ABNORMAL HIGH (ref 0–37)
Albumin: 1.9 g/dL — ABNORMAL LOW (ref 3.5–5.2)
Chloride: 96 mEq/L (ref 96–112)
Creatinine, Ser: 1.08 mg/dL (ref 0.50–1.10)
Total Bilirubin: 1.1 mg/dL (ref 0.3–1.2)

## 2011-03-04 LAB — STOOL CULTURE

## 2011-03-04 MED ORDER — LEVOFLOXACIN 500 MG PO TABS: 500.0000 mg | ORAL_TABLET | Freq: Every day | ORAL | Status: AC

## 2011-03-04 MED ORDER — THIAMINE HCL 100 MG PO TABS: 100.0000 mg | ORAL_TABLET | Freq: Every day | ORAL | Status: AC

## 2011-03-04 MED ORDER — FOLIC ACID 1 MG PO TABS: 1.0000 mg | ORAL_TABLET | Freq: Every day | ORAL | Status: AC

## 2011-03-04 NOTE — Progress Notes (Signed)
CARE MANAGEMENT NOTE 03/04/2011  Patient:  Abigail Crawford, Abigail Crawford   Account Number:  [id number]  Date Initiated:  03/02/2011  Documentation initiated by:  Junius Creamer  Subjective/Objective Assessment:   adm w hyponatremia     Action/Plan:   lives w fam, pcp dr Mady Gemma   Anticipated DC Date:  03/02/2011   Anticipated DC Plan:  HOME/SELF CARE      DC Planning Services  CM consult      [facility] Choice  HOME HEALTH   Choice offered to / List presented to:  C-1 Patient        HH arranged  HH-2 PT  HH-3 OT      [facility] agency  [facility]   Status of service:  Completed, signed off Medicare Important Message given?   (If response is "NO", the following Medicare IM given date fields will be blank) Date Medicare IM given:   Date Additional Medicare IM given:    Discharge Disposition:  HOME W HOME HEALTH SERVICES  Per UR Regulation:  Reviewed for med. necessity/level of care/duration of stay  Comments:  03/03/2010 1655 Spoke to pt and states she did not have preference for [facility]. Contacted [facility] for [facility] PT and OT for scheduled d/c today. Provided rep with facesheet, orders, F2F and d/c summary. Isidoro Donning RN CCM Case Mgmt phone [phone]

## 2011-03-04 NOTE — Progress Notes (Signed)
1700  Pt d/c with son via w/c @ 1020.  IV d/c to left a/c area, skin tear with tape removal and small 2x2 opsite applied with instructions to leave on as long as possible for healing process, also reminded pt that other areas of bruises (to forearms) may also break if bumped or tape torn, paper tape used to IV removal site due to some bleeding, pressure applied for a few min till stopped.  Pt alert and oriented, with assistance dressed for d/c.  Generalized weakness needing assistance with most activities including drinking from a cup.  Instructions given to son regarding diet, monitor fluid intake and not over do it, medications and HH followup.  Copy given with 1 Rx.  States he has to go to work but his 51 year old son will be there to assist the patient.  To increase activity slowly.  Next MD visit will take list with them.  Bonney Leitz RN

## 2011-03-04 NOTE — Discharge Summary (Signed)
[address] 61 y.o., DOB 26-Apr-1950, MRN [id number]. Admission date: 02/28/2011 Discharge Date 03/04/2011 Primary MD Mady Gemma, [place], PA-C Admitting Physician Baltazar Najjar  Admission Diagnosis  Diarrhea [787.91] Hypokalemia [276.8] Hyponatremia [276.1] Weakness [780.79] UTI (lower urinary tract infection) [599.0] Hyponatremia, UTI  Discharge Diagnosis   Principal Problem:  *Hyponatremia Active Problems:  Diarrhea  Liver cirrhosis  UTI (lower urinary tract infection)  Weakness generalized      Past Medical History  Diagnosis Date  . CHF (congestive heart failure)   . Cirrhosis   . COPD (chronic obstructive pulmonary disease)   . Chronic kidney disease     "from cirrhosis of the liver"  . Anxiety   . Arthritis   . GERD (gastroesophageal reflux disease)   . Asthma   . Migraines 02/28/11    "I have bad migraines"    Past Surgical History  Procedure Date  . Cataract extraction w/ intraocular lens  implant, bilateral 2011  . Appendectomy 1965   Brief H&P This is a 61 years old woman with history of liver cirrhosis and other medical problems, she was seen by her PCP recently for generalized weakness, falls and had blood work done that apparently showed hyponatremia. Patient was called by her PCP last night and was advised to go to the ER. Patient came to the ER today her main complains with generalized weakness and falls with imbalance gait. She stated that she has been having diarrhea for 3 years she described it as watery nonbloody about 5 times a day. As per her had a colonoscopy done 5 years ago that showed 2 polyps which were removed otherwise was told normal. She have a poor appetite and nausea chronically however no vomiting. She denies any dysuria, flanks pain, fever or chills.  in the ED her labs showed hyponatremia with sodium level of 121 and hypokalemia with potassium level of 3.2 and we were asked to admit for further evaluation.      Hospital Course    Hyponatremia:   Principal Problem:  *Hyponatremia Patient had sodium of 121 which was secondary to beer potomania and dehydration. Her sodium has improved after putting on fluid restriction. On the day of discharge sodium is improved to 129.  Diarrhea Resolved, stool studies are negative. Stool for C diff is negative.  Liver cirrhosis Pt will continue lasix, aldactone.   UTI (lower urinary tract infection) Urine culture growing enterobacter, will d/con Po levaquin for seven days.  Will get home health PT/OT T  Significant Tests:  See full reports for all details    Dg Chest 2 View  02/28/2011  *RADIOLOGY REPORT*  Clinical Data: Cough, weakness  CHEST - 2 VIEW  Comparison: 02/17/2008  Findings: Cardiomediastinal silhouette is unremarkable.  No acute infiltrate or pleural effusion.  No pulmonary edema.  Bony thorax is unremarkable.  Minimal hyperinflation.  IMPRESSION: No active disease.  Original Report Authenticated By: Natasha Mead, M.D.     Today   Subjective:   Abigail Crawford today has no headache,no chest abdominal pain, Objective:   Blood pressure 98/63, pulse 61, temperature 97.3 F (36.3 C), temperature source Oral, resp. rate 20, height 5\' 3"  (1.6 m), weight 62.1 kg (136 lb 14.5 oz), SpO2 97.00%.  Intake/Output Summary (Last 24 hours) at 03/04/11 1632 Last data filed at 03/04/11 1440  Gross per 24 hour  Intake    240 ml  Output    100 ml  Net    140 ml    Exam Awake Alert, Oriented *  3, No new F.N deficits, Normal affect [place].AT,PERRAL Supple Neck,No JVD, No cervical lymphadenopathy appriciated.  Symmetrical Chest wall movement, Good air movement bilaterally, CTAB RRR,No Gallops,Rubs or new Murmurs, No Parasternal Heave +ve B.Sounds, Abd Soft, Non tender, No organomegaly appriciated, No rebound -guarding or rigidity. No Cyanosis, Clubbing or edema, No new Rash or bruise  Data Review     CBC w Diff: Lab Results  Component Value Date   WBC 5.4 03/01/2011   HGB  9.2* 03/01/2011   HCT 26.4* 03/01/2011   PLT 103* 03/01/2011   LYMPHOPCT 23 02/28/2011   MONOPCT 13* 02/28/2011   EOSPCT 1 02/28/2011   BASOPCT 0 02/28/2011   CMP: Lab Results  Component Value Date   NA 129* 03/04/2011   K 4.3 03/04/2011   CL 96 03/04/2011   CO2 24 03/04/2011   BUN 11 03/04/2011   CREATININE 1.08 03/04/2011   PROT 5.7* 03/04/2011   ALBUMIN 1.9* 03/04/2011   BILITOT 1.1 03/04/2011   ALKPHOS 123* 03/04/2011   AST 43* 03/04/2011   ALT 14 03/04/2011  .  Micro Results Recent Results (from the past 240 hour(s))  URINE CULTURE     Status: Normal   Collection Time   02/28/11 11:27 AM      Component Value Range Status Comment   Specimen Description URINE, RANDOM   Final    Special Requests NONE   Final    Setup Time [id number]   Final    Colony Count >=100,000 COLONIES/ML   Final    Culture ENTEROBACTER CLOACAE   Final    Report Status 03/02/2011 FINAL   Final    Organism ID, Bacteria ENTEROBACTER CLOACAE   Final   STOOL CULTURE     Status: Normal (Preliminary result)   Collection Time   03/01/11 12:31 AM      Component Value Range Status Comment   Specimen Description STOOL   Final    Special Requests NONE   Final    Culture Culture reincubated for better growth   Final    Report Status PENDING   Incomplete   CLOSTRIDIUM DIFFICILE BY PCR     Status: Normal   Collection Time   03/01/11 12:31 AM      Component Value Range Status Comment   C difficile by pcr NEGATIVE  NEGATIVE  Final      Discharge Instructions      Follow-up Information    Follow up with KAPLAN,KRISTEN, PA in 1 week.         Discharge Medications   Levaquin 500 mg po daily x 7 days.  Medication List  As of 03/04/2011  4:32 PM   START taking these medications         folic acid 1 MG tablet   Commonly known as: FOLVITE   Take 1 tablet (1 mg total) by mouth daily.      thiamine 100 MG tablet   Take 1 tablet (100 mg total) by mouth daily.         CONTINUE taking these medications         ALPRAZolam 0.5 MG  tablet   Commonly known as: XANAX      esomeprazole 40 MG capsule   Commonly known as: NEXIUM      furosemide 20 MG tablet   Commonly known as: LASIX      propranolol 10 MG tablet   Commonly known as: INDERAL      spironolactone 25 MG tablet   Commonly  known as: ALDACTONE      traMADol 50 MG tablet   Commonly known as: ULTRAM          Where to get your medications    These are the prescriptions that you need to pick up. We sent them to a specific pharmacy, so you will need to go there to get them.   [id number] - [place], [place] - [address] [place] [address] [place] [address]    [address] [place] [address]    Phone: [phone]        folic acid 1 MG tablet   thiamine 100 MG tablet             Total Time in preparing paper work, data evaluation and todays exam - 35 minutes  Coleman Kalas S M.D on 03/04/2011 at 4:32 PM  [facility] Office  [phone]

## 2012-07-09 ENCOUNTER — Emergency Department (HOSPITAL_COMMUNITY)
Admission: EM | Admit: 2012-07-09 | Discharge: 2012-07-09 | Disposition: A | Discharge status: Home / Self Care | Payer: Medicare Other | Attending: Emergency Medicine | Admitting: Emergency Medicine

## 2012-07-09 ENCOUNTER — Emergency Department (HOSPITAL_COMMUNITY): Payer: Medicare Other

## 2012-07-09 ENCOUNTER — Other Ambulatory Visit: Payer: Self-pay

## 2012-07-09 ENCOUNTER — Encounter (HOSPITAL_COMMUNITY): Payer: Self-pay | Admitting: Emergency Medicine

## 2012-07-09 DIAGNOSIS — J449 Chronic obstructive pulmonary disease, unspecified: Secondary | ICD-10-CM | POA: Insufficient documentation

## 2012-07-09 DIAGNOSIS — J4489 Other specified chronic obstructive pulmonary disease: Secondary | ICD-10-CM | POA: Insufficient documentation

## 2012-07-09 DIAGNOSIS — F17200 Nicotine dependence, unspecified, uncomplicated: Secondary | ICD-10-CM | POA: Insufficient documentation

## 2012-07-09 DIAGNOSIS — I509 Heart failure, unspecified: Secondary | ICD-10-CM | POA: Insufficient documentation

## 2012-07-09 DIAGNOSIS — M129 Arthropathy, unspecified: Secondary | ICD-10-CM | POA: Insufficient documentation

## 2012-07-09 DIAGNOSIS — R1084 Generalized abdominal pain: Secondary | ICD-10-CM | POA: Insufficient documentation

## 2012-07-09 DIAGNOSIS — N189 Chronic kidney disease, unspecified: Secondary | ICD-10-CM | POA: Insufficient documentation

## 2012-07-09 DIAGNOSIS — K219 Gastro-esophageal reflux disease without esophagitis: Secondary | ICD-10-CM | POA: Insufficient documentation

## 2012-07-09 DIAGNOSIS — R109 Unspecified abdominal pain: Secondary | ICD-10-CM

## 2012-07-09 DIAGNOSIS — J45909 Unspecified asthma, uncomplicated: Secondary | ICD-10-CM | POA: Insufficient documentation

## 2012-07-09 DIAGNOSIS — K7460 Unspecified cirrhosis of liver: Secondary | ICD-10-CM | POA: Insufficient documentation

## 2012-07-09 DIAGNOSIS — Z8679 Personal history of other diseases of the circulatory system: Secondary | ICD-10-CM | POA: Insufficient documentation

## 2012-07-09 DIAGNOSIS — Z79899 Other long term (current) drug therapy: Secondary | ICD-10-CM | POA: Insufficient documentation

## 2012-07-09 DIAGNOSIS — F411 Generalized anxiety disorder: Secondary | ICD-10-CM | POA: Insufficient documentation

## 2012-07-09 HISTORY — DX: Other ascites: R18.8

## 2012-07-09 LAB — POCT I-STAT, CHEM 8
Calcium, Ion: 1.12 mmol/L — ABNORMAL LOW (ref 1.13–1.30)
Chloride: 105 mEq/L (ref 96–112)
HCT: 34 % — ABNORMAL LOW (ref 36.0–46.0)
Hemoglobin: 11.6 g/dL — ABNORMAL LOW (ref 12.0–15.0)
Potassium: 3.9 mEq/L (ref 3.5–5.1)

## 2012-07-09 LAB — CBC WITH DIFFERENTIAL/PLATELET
Eosinophils Absolute: 0.2 10*3/uL (ref 0.0–0.7)
Eosinophils Relative: 3 % (ref 0–5)
Hemoglobin: 11.1 g/dL — ABNORMAL LOW (ref 12.0–15.0)
Lymphs Abs: 2.4 10*3/uL (ref 0.7–4.0)
MCH: 29.6 pg (ref 26.0–34.0)
MCV: 88.3 fL (ref 78.0–100.0)
Monocytes Absolute: 0.4 10*3/uL (ref 0.1–1.0)
Monocytes Relative: 6 % (ref 3–12)
Platelets: 146 10*3/uL — ABNORMAL LOW (ref 150–400)
RBC: 3.75 MIL/uL — ABNORMAL LOW (ref 3.87–5.11)

## 2012-07-09 LAB — COMPREHENSIVE METABOLIC PANEL
BUN: 15 mg/dL (ref 6–23)
Calcium: 9.3 mg/dL (ref 8.4–10.5)
GFR calc Af Amer: 48 mL/min — ABNORMAL LOW (ref >90)
Glucose, Bld: 94 mg/dL (ref 70–99)
Total Protein: 6.8 g/dL (ref 6.0–8.3)

## 2012-07-09 LAB — PRO B NATRIURETIC PEPTIDE: Pro B Natriuretic peptide (BNP): 292.9 pg/mL — ABNORMAL HIGH (ref 0–125)

## 2012-07-09 LAB — POCT I-STAT TROPONIN I: Troponin i, poc: 0.01 ng/mL (ref 0.00–0.08)

## 2012-07-09 MED ORDER — MORPHINE SULFATE 4 MG/ML IJ SOLN
4.0000 mg | Freq: Once | INTRAMUSCULAR | Status: AC
  Administered 2012-07-09: 4 mg via INTRAVENOUS
  Filled 2012-07-09: qty 1

## 2012-07-09 MED ORDER — IOHEXOL 300 MG/ML  SOLN: 25.0000 mL | INTRAMUSCULAR | Status: AC

## 2012-07-09 MED ORDER — IOHEXOL 300 MG/ML  SOLN
80.0000 mL | Freq: Once | INTRAMUSCULAR | Status: AC | PRN
  Administered 2012-07-09: 80 mL via INTRAVENOUS

## 2012-07-09 NOTE — ED Notes (Signed)
Paracentesis tray at bedside.  Consent obtained.  MD at bedside.

## 2012-07-09 NOTE — ED Provider Notes (Signed)
History     CSN: [id number]  Arrival date & time 07/09/12  1132   First MD Initiated Contact with Patient 07/09/12 1158      Chief Complaint  Patient presents with  . Abdominal Pain    (Consider location/radiation/quality/duration/timing/severity/associated sxs/prior treatment) HPI Comments: 62 y.o. female who has pmh of cirrhosis, presents to the Er as she states over the past month she has gained about 10 to 15 pounds and has "fluid in my belly". She states that when she gets fluid she will get it drained -- last time this was done was about a year ago. She has not had fevers or chills. She states no n/v. She went to her pcp today -- and she states her PCP told her to come to the Er as she might need to get "fluid drained".  She does complain of intermittent abdominal pain.   Patient is a 62 y.o. female presenting with abdominal pain. The history is provided by the patient.  Abdominal Pain Pain location:  Generalized Pain quality: not aching and not bloating   Pain radiates to:  Does not radiate Pain severity:  Mild Onset quality:  Gradual Relieved by:  Nothing Worsened by:  Nothing tried Associated symptoms: no chest pain, no chills, no cough, no diarrhea, no fatigue, no fever, no vaginal discharge and no vomiting     Past Medical History  Diagnosis Date  . CHF (congestive heart failure)   . Cirrhosis   . COPD (chronic obstructive pulmonary disease)   . Chronic kidney disease     "from cirrhosis of the liver"  . Anxiety   . Arthritis   . GERD (gastroesophageal reflux disease)   . Asthma   . Migraines 02/28/11    "I have bad migraines"  . Ascitic fluid     Past Surgical History  Procedure Laterality Date  . Cataract extraction w/ intraocular lens  implant, bilateral  2011  . Appendectomy  1965    No family history on file.  History  Substance Use Topics  . Smoking status: Current Every Day Smoker -- 1.00 packs/day for 45 years    Types: Cigarettes  . Smokeless  tobacco: Never Used  . Alcohol Use: 21.0 oz/week    35 Cans of beer per week    OB History   Grav Para Term Preterm Abortions TAB SAB Ect Mult Living                  Review of Systems  Constitutional: Negative for fever, chills and fatigue.  HENT: Negative for facial swelling, drooling, neck pain and dental problem.   Eyes: Negative for pain, discharge and itching.  Respiratory: Negative for cough, choking, wheezing and stridor.   Cardiovascular: Negative for chest pain.  Gastrointestinal: Positive for abdominal pain. Negative for vomiting and diarrhea.  Endocrine: Negative for cold intolerance and heat intolerance.  Genitourinary: Negative for vaginal discharge, difficulty urinating and vaginal pain.  Skin: Negative for pallor and rash.  Neurological: Negative for dizziness, light-headedness and headaches.  Psychiatric/Behavioral: Negative for behavioral problems and agitation.    Allergies  Tylenol; Aspirin; and Cortisone  Home Medications   Current Outpatient Rx  Name  Route  Sig  Dispense  Refill  . ALPRAZolam (XANAX) 0.5 MG tablet   Oral   Take 0.5 mg by mouth 3 (three) times daily.           Marland Kitchen esomeprazole (NEXIUM) 40 MG capsule   Oral   Take 40 mg by  mouth daily before breakfast.           . furosemide (LASIX) 20 MG tablet   Oral   Take 20 mg by mouth daily.           . propranolol (INDERAL) 10 MG tablet   Oral   Take 10 mg by mouth daily.           Marland Kitchen spironolactone (ALDACTONE) 25 MG tablet   Oral   Take 25 mg by mouth daily.           . traMADol (ULTRAM) 50 MG tablet   Oral   Take 50 mg by mouth 3 (three) times daily. Maximum dose= 8 tablets per day            BP 114/48  Temp(Src) 98.4 F (36.9 C) (Oral)  SpO2 98%  Physical Exam  Constitutional: She is oriented to person, place, and time. She appears well-developed. No distress.  HENT:  Head: Normocephalic and atraumatic.  Eyes: Pupils are equal, round, and reactive to light.  Right eye exhibits no discharge. Left eye exhibits no discharge.  Neck: Neck supple. No tracheal deviation present.  Cardiovascular: Normal rate.  Exam reveals no gallop and no friction rub.   Pulmonary/Chest: No stridor. No respiratory distress. She has no wheezes.  Abdominal: Soft. She exhibits no distension. Tenderness: minimal abd ttp. There is no rebound.  Musculoskeletal: She exhibits no edema and no tenderness.  Neurological: She is alert and oriented to person, place, and time.  Skin: Skin is warm. She is not diaphoretic.    ED Course  Procedures (including critical care time)  Labs Reviewed  CBC WITH DIFFERENTIAL - Abnormal; Notable for the following:    RBC 3.75 (*)    Hemoglobin 11.1 (*)    HCT 33.1 (*)    Platelets 146 (*)    All other components within normal limits  COMPREHENSIVE METABOLIC PANEL - Abnormal; Notable for the following:    Creatinine, Ser 1.34 (*)    Albumin 2.8 (*)    Alkaline Phosphatase 124 (*)    GFR calc non Af Amer 41 (*)    GFR calc Af Amer 48 (*)    All other components within normal limits  PRO B NATRIURETIC PEPTIDE - Abnormal; Notable for the following:    Pro B Natriuretic peptide (BNP) 292.9 (*)    All other components within normal limits  POCT I-STAT, CHEM 8 - Abnormal; Notable for the following:    Creatinine, Ser 1.20 (*)    Calcium, Ion 1.12 (*)    Hemoglobin 11.6 (*)    HCT 34.0 (*)    All other components within normal limits  POCT I-STAT TROPONIN I   No results found.    MDM  Pt on exam without significant ascites, further, when U/S probe is placed, cannot appreciate a pocket of fluid that could be drawn off. Due to pt's complaining of abd pain, will get CT abd pelvis. Do not suspect she has SBP -- especially without her having fluid that can be drawn off. No fevers or chills, normal labs. Will get CT abd pelvis -- if negative, will d/c home w/ close pcp followup.   1. Abdominal pain            Bernadene Person,  MD 07/12/12 1610

## 2012-07-09 NOTE — ED Notes (Signed)
Pt is SB on telemetry with HR in the 40's.  Pt is asymptomatic at this time.  Will continue to monitor.

## 2012-07-09 NOTE — ED Notes (Signed)
Has hx ascities and needs fluid drawn off abd .  abd pain x 4-5 days and gained wt went to see her pcp and was sent for further tests

## 2012-07-09 NOTE — ED Notes (Signed)
Patient transported to CT 

## 2012-07-12 NOTE — ED Provider Notes (Signed)
I have supervised the resident on the management of this patient and agree with the note above. I personally interviewed and examined the patient and my addendum is below.   Abigail Crawford is a 62 y.o. female hx of cirrhosis from alcohol use here with weight gain and abdominal pain. States that she gained 10-15 pounds and her abdomen more distended. She went to PCP and was told to come to ED to get "fluid drained". No fever or vomiting. Abdomen is distended but not tympanic. No obvious fluid wave. Slight mild diffuse tenderness but no rebound. Bedside US showed minimal abdominal fluid and not enough for paracentesis. CT ordered and signed out to the on coming provider. CT showed stable cirrhosis and minimal ascites. Labs unremarkable. Patient was later sent home by the on coming provider.   EMERGENCY DEPARTMENT [place] FAST EXAM  INDICATIONS:Tachycardia  PERFORMED BY: Myself  IMAGES ARCHIVED?: No  FINDINGS: RUQ view positive  LIMITATIONS:  Body habitus  INTERPRETATION:  Abdominal free fluid present  COMMENT:  Patient hx of cirrhosis with ascites. No trauma. Minimal ascites around liver. Trace in the pelvic area. Not enough for paracentesis.     Richardean Canal, MD 07/12/12 [phone]

## 2013-12-15 ENCOUNTER — Emergency Department (HOSPITAL_COMMUNITY): Payer: Medicare Other

## 2013-12-15 ENCOUNTER — Encounter (HOSPITAL_COMMUNITY): Payer: Self-pay | Admitting: Emergency Medicine

## 2013-12-15 ENCOUNTER — Emergency Department (HOSPITAL_COMMUNITY)
Admission: EM | Admit: 2013-12-15 | Discharge: 2013-12-15 | Disposition: A | Discharge status: Home / Self Care | Payer: Medicare Other | Attending: Emergency Medicine | Admitting: Emergency Medicine

## 2013-12-15 DIAGNOSIS — K7030 Alcoholic cirrhosis of liver without ascites: Secondary | ICD-10-CM | POA: Diagnosis not present

## 2013-12-15 DIAGNOSIS — F419 Anxiety disorder, unspecified: Secondary | ICD-10-CM | POA: Diagnosis not present

## 2013-12-15 DIAGNOSIS — G43909 Migraine, unspecified, not intractable, without status migrainosus: Secondary | ICD-10-CM | POA: Insufficient documentation

## 2013-12-15 DIAGNOSIS — J449 Chronic obstructive pulmonary disease, unspecified: Secondary | ICD-10-CM | POA: Insufficient documentation

## 2013-12-15 DIAGNOSIS — R109 Unspecified abdominal pain: Secondary | ICD-10-CM | POA: Diagnosis not present

## 2013-12-15 DIAGNOSIS — K219 Gastro-esophageal reflux disease without esophagitis: Secondary | ICD-10-CM | POA: Insufficient documentation

## 2013-12-15 DIAGNOSIS — Z720 Tobacco use: Secondary | ICD-10-CM | POA: Insufficient documentation

## 2013-12-15 DIAGNOSIS — M1990 Unspecified osteoarthritis, unspecified site: Secondary | ICD-10-CM | POA: Insufficient documentation

## 2013-12-15 DIAGNOSIS — I509 Heart failure, unspecified: Secondary | ICD-10-CM | POA: Diagnosis not present

## 2013-12-15 DIAGNOSIS — N189 Chronic kidney disease, unspecified: Secondary | ICD-10-CM | POA: Insufficient documentation

## 2013-12-15 DIAGNOSIS — Z79899 Other long term (current) drug therapy: Secondary | ICD-10-CM | POA: Insufficient documentation

## 2013-12-15 DIAGNOSIS — R079 Chest pain, unspecified: Secondary | ICD-10-CM

## 2013-12-15 LAB — I-STAT TROPONIN, ED: TROPONIN I, POC: 0.01 ng/mL (ref 0.00–0.08)

## 2013-12-15 LAB — COMPREHENSIVE METABOLIC PANEL
ALBUMIN: 3.5 g/dL (ref 3.5–5.2)
ALK PHOS: 121 U/L — AB (ref 39–117)
ALT: 9 U/L (ref 0–35)
ANION GAP: 14 (ref 5–15)
AST: 23 U/L (ref 0–37)
BUN: 13 mg/dL (ref 6–23)
CO2: 25 mEq/L (ref 19–32)
CREATININE: 1.21 mg/dL — AB (ref 0.50–1.10)
Calcium: 9.5 mg/dL (ref 8.4–10.5)
Chloride: 102 mEq/L (ref 96–112)
GFR calc non Af Amer: 46 mL/min — ABNORMAL LOW (ref >90)
GFR, EST AFRICAN AMERICAN: 54 mL/min — AB (ref >90)
GLUCOSE: 96 mg/dL (ref 70–99)
POTASSIUM: 4.6 meq/L (ref 3.7–5.3)
Sodium: 141 mEq/L (ref 137–147)
TOTAL PROTEIN: 7.8 g/dL (ref 6.0–8.3)
Total Bilirubin: 1.1 mg/dL (ref 0.3–1.2)

## 2013-12-15 LAB — CBC WITH DIFFERENTIAL/PLATELET
BASOS PCT: 0 % (ref 0–1)
Basophils Absolute: 0 10*3/uL (ref 0.0–0.1)
EOS ABS: 0.1 10*3/uL (ref 0.0–0.7)
EOS PCT: 2 % (ref 0–5)
HEMATOCRIT: 37 % (ref 36.0–46.0)
HEMOGLOBIN: 11.9 g/dL — AB (ref 12.0–15.0)
LYMPHS ABS: 2.1 10*3/uL (ref 0.7–4.0)
Lymphocytes Relative: 31 % (ref 12–46)
MCH: 27.9 pg (ref 26.0–34.0)
MCHC: 32.2 g/dL (ref 30.0–36.0)
MCV: 86.9 fL (ref 78.0–100.0)
MONO ABS: 0.5 10*3/uL (ref 0.1–1.0)
MONOS PCT: 7 % (ref 3–12)
Neutro Abs: 4.1 10*3/uL (ref 1.7–7.7)
Neutrophils Relative %: 60 % (ref 43–77)
Platelets: 198 10*3/uL (ref 150–400)
RBC: 4.26 MIL/uL (ref 3.87–5.11)
RDW: 15.8 % — ABNORMAL HIGH (ref 11.5–15.5)
WBC: 6.8 10*3/uL (ref 4.0–10.5)

## 2013-12-15 LAB — POC OCCULT BLOOD, ED: Fecal Occult Bld: NEGATIVE

## 2013-12-15 LAB — URINALYSIS, ROUTINE W REFLEX MICROSCOPIC
BILIRUBIN URINE: NEGATIVE
Glucose, UA: NEGATIVE mg/dL
Hgb urine dipstick: NEGATIVE
KETONES UR: NEGATIVE mg/dL
LEUKOCYTES UA: NEGATIVE
NITRITE: POSITIVE — AB
PH: 6.5 (ref 5.0–8.0)
Protein, ur: NEGATIVE mg/dL
SPECIFIC GRAVITY, URINE: 1.011 (ref 1.005–1.030)
UROBILINOGEN UA: 1 mg/dL (ref 0.0–1.0)

## 2013-12-15 LAB — PROTIME-INR
INR: 1.16 (ref 0.00–1.49)
PROTHROMBIN TIME: 15 s (ref 11.6–15.2)

## 2013-12-15 LAB — PRO B NATRIURETIC PEPTIDE: Pro B Natriuretic peptide (BNP): 195.9 pg/mL — ABNORMAL HIGH (ref 0–125)

## 2013-12-15 LAB — APTT: aPTT: 36 seconds (ref 24–37)

## 2013-12-15 LAB — URINE MICROSCOPIC-ADD ON

## 2013-12-15 LAB — LIPASE, BLOOD: Lipase: 62 U/L — ABNORMAL HIGH (ref 11–59)

## 2013-12-15 MED ORDER — IOHEXOL 300 MG/ML  SOLN: 25.0000 mL | INTRAMUSCULAR | Status: AC

## 2013-12-15 MED ORDER — IOHEXOL 300 MG/ML  SOLN
80.0000 mL | Freq: Once | INTRAMUSCULAR | Status: AC | PRN
  Administered 2013-12-15: 80 mL via INTRAVENOUS

## 2013-12-15 MED ORDER — FENTANYL CITRATE 0.05 MG/ML IJ SOLN
75.0000 ug | Freq: Once | INTRAMUSCULAR | Status: AC
  Administered 2013-12-15: 75 ug via INTRAVENOUS
  Filled 2013-12-15: qty 2

## 2013-12-15 MED ORDER — SODIUM CHLORIDE 0.9 % IV BOLUS (SEPSIS)
500.0000 mL | Freq: Once | INTRAVENOUS | Status: AC
  Administered 2013-12-15: 500 mL via INTRAVENOUS

## 2013-12-15 NOTE — ED Notes (Signed)
MD at bedside. 

## 2013-12-15 NOTE — ED Provider Notes (Signed)
CSN: [id number]     Arrival date & time 12/15/13  1447 History   First MD Initiated Contact with Patient 12/15/13 1752     Chief Complaint  Patient presents with  . Abdominal Pain  . Chest Pain     (Consider location/radiation/quality/duration/timing/severity/associated sxs/prior Treatment) Patient is a 63 y.o. female presenting with abdominal pain. The history is provided by the patient.  Abdominal Pain Pain location:  Generalized Pain quality: aching and fullness   Pain radiates to:  Chest Pain severity:  Mild Onset quality:  Gradual Duration:  5 days Timing:  Intermittent Progression:  Waxing and waning Chronicity:  Recurrent (Patient states feels like she has ascites) Context comment:  Patient past medical history of cirrhosis, states she's had intermittent abdominal pain fluid. No regular at paracenteses. Abdomen for 5 days intermittent. No vomiting, no diarrhea, no dysuria. Not compliant Relieved by:  Nothing Worsened by:  Nothing tried Ineffective treatments:  None tried Associated symptoms: chest pain (Pain radiates into chest)   Associated symptoms: no anorexia, no chills, no cough, no diarrhea, no dysuria, no fatigue, no fever, no flatus, no hematochezia, no hematuria, no melena, no nausea, no shortness of breath and no vomiting   Risk factors: alcohol abuse, being elderly and obesity     Past Medical History  Diagnosis Date  . CHF (congestive heart failure)   . Cirrhosis   . COPD (chronic obstructive pulmonary disease)   . Chronic kidney disease     "from cirrhosis of the liver"  . Anxiety   . Arthritis   . GERD (gastroesophageal reflux disease)   . Asthma   . Migraines 02/28/11    "I have bad migraines"  . Ascitic fluid    Past Surgical History  Procedure Laterality Date  . Cataract extraction w/ intraocular lens  implant, bilateral  2011  . Appendectomy  1965   No family history on file. History  Substance Use Topics  . Smoking status: Current Every  Day Smoker -- 1.00 packs/day for 45 years    Types: Cigarettes  . Smokeless tobacco: Never Used  . Alcohol Use: 21.0 oz/week    35 Cans of beer per week   OB History   Grav Para Term Preterm Abortions TAB SAB Ect Mult Living                 Review of Systems  Constitutional: Negative for fever, chills, activity change, appetite change and fatigue.  HENT: Negative for congestion and rhinorrhea.   Eyes: Negative for discharge, redness and itching.  Respiratory: Negative for cough, shortness of breath and wheezing.   Cardiovascular: Positive for chest pain (Pain radiates into chest).  Gastrointestinal: Positive for abdominal pain. Negative for nausea, vomiting, diarrhea, melena, hematochezia, anorexia and flatus.  Genitourinary: Negative for dysuria and hematuria.  Musculoskeletal: Negative for back pain.  Skin: Negative for rash and wound.  Neurological: Negative for syncope.      Allergies  Tylenol; Aspirin; Cortisone; and Tea  Home Medications   Prior to Admission medications   Medication Sig Start Date End Date Taking? Authorizing Provider  albuterol (PROVENTIL HFA;VENTOLIN HFA) 108 (90 BASE) MCG/ACT inhaler Inhale 2 puffs into the lungs every 4 (four) hours. Scheduled   Yes Historical Provider, MD  ALPRAZolam (XANAX) 0.5 MG tablet Take 0.5 mg by mouth 4 (four) times daily as needed.    Yes Historical Provider, MD  esomeprazole (NEXIUM) 40 MG capsule Take 40 mg by mouth daily before breakfast.     Yes  Historical Provider, MD  folic acid (FOLVITE) 1 MG tablet Take 1 mg by mouth daily.   Yes Historical Provider, MD  furosemide (LASIX) 20 MG tablet Take 20 mg by mouth daily.     Yes Historical Provider, MD  pantoprazole (PROTONIX) 40 MG tablet Take 40 mg by mouth daily.   Yes Historical Provider, MD  polyethylene glycol (MIRALAX / GLYCOLAX) packet Take 17 g by mouth daily.   Yes Historical Provider, MD  propranolol (INDERAL) 10 MG tablet Take 10 mg by mouth daily.     Yes  Historical Provider, MD  spironolactone (ALDACTONE) 25 MG tablet Take 25 mg by mouth daily.     Yes Historical Provider, MD  thiamine (VITAMIN B-1) 100 MG tablet Take 100 mg by mouth daily.   Yes Historical Provider, MD  traMADol (ULTRAM) 50 MG tablet Take 50 mg by mouth 3 (three) times daily. Maximum dose= 8 tablets per day    Yes Historical Provider, MD  traZODone (DESYREL) 50 MG tablet Take 50-100 mg by mouth at bedtime.   Yes Historical Provider, MD   BP 131/62  Pulse 58  Temp(Src) 98.9 F (37.2 C)  Resp 16  Wt 144 lb (65.318 kg)  SpO2 99% Physical Exam  Constitutional: She is oriented to person, place, and time. She appears well-developed and well-nourished. No distress.  No acute distress  HENT:  Head: Normocephalic and atraumatic.  Mouth/Throat: Oropharynx is clear and moist. No oropharyngeal exudate.  Eyes: Conjunctivae and EOM are normal. Pupils are equal, round, and reactive to light. Right eye exhibits no discharge. Left eye exhibits no discharge. No scleral icterus.  Neck: Normal range of motion. Neck supple.  Cardiovascular: Normal rate, regular rhythm and normal heart sounds.   No murmur heard. Pulmonary/Chest: Effort normal and breath sounds normal. No respiratory distress. She has no wheezes. She has no rales.  Abdominal: Soft. She exhibits no distension and no mass. There is tenderness (Mild diffuse lower tenderness palpation, no peritonitis, no rebound no guarding).  Neurological: She is alert and oriented to person, place, and time. She exhibits normal muscle tone. Coordination normal.  Skin: Skin is warm. No rash noted. She is not diaphoretic.    ED Course  Procedures (including critical care time) Labs Review Labs Reviewed  CBC WITH DIFFERENTIAL - Abnormal; Notable for the following:    Hemoglobin 11.9 (*)    RDW 15.8 (*)    All other components within normal limits  COMPREHENSIVE METABOLIC PANEL - Abnormal; Notable for the following:    Creatinine, Ser 1.21  (*)    Alkaline Phosphatase 121 (*)    GFR calc non Af Amer 46 (*)    GFR calc Af Amer 54 (*)    All other components within normal limits  URINALYSIS, ROUTINE W REFLEX MICROSCOPIC - Abnormal; Notable for the following:    Nitrite POSITIVE (*)    All other components within normal limits  LIPASE, BLOOD - Abnormal; Notable for the following:    Lipase 62 (*)    All other components within normal limits  PRO B NATRIURETIC PEPTIDE - Abnormal; Notable for the following:    Pro B Natriuretic peptide (BNP) 195.9 (*)    All other components within normal limits  URINE MICROSCOPIC-ADD ON - Abnormal; Notable for the following:    Bacteria, UA FEW (*)    All other components within normal limits  PROTIME-INR  APTT  POC OCCULT BLOOD, ED  Rosezena SensorI-STAT TROPOININ, ED    Imaging Review Dg  Chest 2 View  12/15/2013   CLINICAL DATA:  Abdominal and LEFT chest pain since 4 days ago, abdominal swelling for 4-5 months, shortness of breath, history cirrhosis, COPD, CHF, asthma, GERD  EXAM: CHEST  2 VIEW  COMPARISON:  07/09/2012  FINDINGS: Normal heart size, mediastinal contours, and pulmonary vascularity.  Lungs clear.  No pneumothorax.  Bones unremarkable.  IMPRESSION: No acute abnormalities.   Electronically Signed   By: Ulyses SouthwardMark  Boles M.D.   On: 12/15/2013 16:06   Ct Abdomen Pelvis W Contrast  12/15/2013   CLINICAL DATA:  Mid chest pain and lower abdominal pain for 4 days.  EXAM: CT ABDOMEN AND PELVIS WITH CONTRAST  TECHNIQUE: Multidetector CT imaging of the abdomen and pelvis was performed using the standard protocol following bolus administration of intravenous contrast.  CONTRAST:  80mL OMNIPAQUE IOHEXOL 300 MG/ML  SOLN  COMPARISON:  07/09/2012  FINDINGS: Minimal dependent changes in the lung bases.  Changes of hepatic cirrhosis with heterogeneous parenchymal pattern, nodular contour, and relative prominence of the lateral segment left lobe and caudate lobes. No focal mass is identified. Portal veins are patent.  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 Cholelithiasis. No gallbladder wall thickening or infiltration. Pancreas, spleen, adrenal glands, kidneys, inferior vena cava, and retroperitoneal lymph nodes are unremarkable. Calcification of aorta without aneurysm. Stomach and small bowel are mostly decompressed. Contrast material does flow through to the rectum suggesting no evidence of bowel or colonic obstruction. No free air or free fluid in the abdomen.  Pelvis: Uterus and ovaries are not enlarged. No pelvic mass or lymphadenopathy. Appendix is not identified. No inflammatory changes in the right lower quadrant. No evidence of diverticulitis. No free or loculated pelvic fluid collections. Bladder wall is not thickened. Mild degenerative changes in the spine with slight endplate compression of L5. No destructive bone lesions.  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 Cholelithiasis without inflammatory changes.   Electronically Signed   By: Burman NievesWilliam  Stevens M.D.   On: 12/15/2013 21:15     EKG Interpretation   Date/Time:  Monday December 15 2013 15:00:26 EDT Ventricular Rate:  49 PR Interval:  150 QRS Duration: 78 QT Interval:  464 QTC Calculation: 419 R Axis:   82 Text Interpretation:  Sinus bradycardia Anterior infarct , age  undetermined Abnormal ECG Confirmed by Rubin PayorPICKERING  MD, Harrold DonathNATHAN [phone] on  12/15/2013 6:36:02 PM      MDM   MDM: 63 year-old female with past medical history of CHF, cirrhosis, COPD with chief complaint of abdominal pain. Has had for 5 days. States feels like she has fluid accumulating. Has had paracentesis before but no history of SBP, no regular paracentesis. She denies fever, nausea, vomiting, diarrhea, urinary symptoms, shortness of breath. States the pain radiates into her chest. It is intermittent a few times a day but states nothing she knows brings it on. Similar pain in the past when she has  fluid accumulate. She does not routinely see her PCP or gastroenterologist. Here she is well-appearing afebrile vital signs stable. Her abdomen is soft, nontender without peritonitis. Labs show unremarkable. EKG with no ischemia. CT for concern for intra-abdominal process. Negative CT. Has some chronic cirrhotic sequela. Chest x-ray negative for lower lobe pneumonia, pneumothorax. Troponin obtained for possible atypical ACS presentation but in the setting of negative troponin with IV symptoms, normal vitals, negative EKG is unlikely a ACS process. No significant ascites on CT or bedside ultrasound, unlikely SBP. 2  patient well-appearing vitals, negative abdominal exam, negative workup unlikely an acute emergency at this time. We'll have her followup PCP which she states she will do. Discharged.  Final diagnoses:  Chest pain  Alcoholic cirrhosis of liver without ascites  Abdominal pain, unspecified abdominal location    Discharge Medication List as of 12/15/2013  9:27 PM     [facility] EMERGENCY DEPARTMENT [id number] [place] [address] [phone]  As needed  Richmond Campbell, PA-C [address] [place] [address] [phone]  In 3 days   Discharged  Pilar Jarvis, MD 12/16/13 [phone]

## 2013-12-15 NOTE — ED Notes (Signed)
abd pain and chest pain since Thursday of last week having swelling in abd the last 4- 5 months and sob has hx of cirrhosis  Had fluid drawn off abd  4 years ago  Has not had f/u till today

## 2013-12-15 NOTE — Discharge Instructions (Signed)

## 2013-12-15 NOTE — ED Notes (Signed)
Patient transported to X-ray 

## 2013-12-16 NOTE — ED Provider Notes (Signed)
I saw and evaluated the patient, reviewed the resident's note and I agree with the findings and plan.   EKG Interpretation   Date/Time:  Monday December 15 2013 15:00:26 EDT Ventricular Rate:  49 PR Interval:  150 QRS Duration: 78 QT Interval:  464 QTC Calculation: 419 R Axis:   82 Text Interpretation:  Sinus bradycardia Anterior infarct , age  undetermined Abnormal ECG Confirmed by Rubin PayorPICKERING  MD, Jerah Esty [phone] on  12/15/2013 6:36:02 PM     Patient with chest and abdominal pain. History of cirrhosis. Does not followup. Labs overall reassuring. CT and reassuring. Will discharge home to followup as needed.  Juliet RudeNathan R. Rubin PayorPickering, MD 12/16/13 1505

## 2014-01-16 ENCOUNTER — Other Ambulatory Visit: Payer: Self-pay | Admitting: Gastroenterology

## 2014-01-16 DIAGNOSIS — R1030 Lower abdominal pain, unspecified: Secondary | ICD-10-CM

## 2014-01-19 ENCOUNTER — Ambulatory Visit
Admission: RE | Admit: 2014-01-19 | Discharge: 2014-01-19 | Disposition: A | Discharge status: Home / Self Care | Payer: Medicare Other | Source: Ambulatory Visit | Attending: Gastroenterology | Admitting: Gastroenterology

## 2014-01-19 DIAGNOSIS — R1030 Lower abdominal pain, unspecified: Secondary | ICD-10-CM

## 2014-02-13 ENCOUNTER — Other Ambulatory Visit: Payer: Self-pay | Admitting: Gastroenterology

## 2014-02-13 DIAGNOSIS — R1030 Lower abdominal pain, unspecified: Secondary | ICD-10-CM

## 2014-03-09 ENCOUNTER — Other Ambulatory Visit: Payer: Medicare Other

## 2014-03-09 ENCOUNTER — Ambulatory Visit
Admission: RE | Admit: 2014-03-09 | Discharge: 2014-03-09 | Disposition: A | Discharge status: Home / Self Care | Payer: Medicaid Other | Source: Ambulatory Visit | Attending: Gastroenterology | Admitting: Gastroenterology

## 2014-03-09 DIAGNOSIS — R1030 Lower abdominal pain, unspecified: Secondary | ICD-10-CM

## 2014-03-12 ENCOUNTER — Other Ambulatory Visit: Payer: Medicare Other

## 2014-03-24 ENCOUNTER — Other Ambulatory Visit (HOSPITAL_COMMUNITY): Payer: Self-pay | Admitting: Gastroenterology

## 2014-03-24 DIAGNOSIS — R1011 Right upper quadrant pain: Secondary | ICD-10-CM

## 2014-03-24 DIAGNOSIS — K8020 Calculus of gallbladder without cholecystitis without obstruction: Secondary | ICD-10-CM

## 2014-03-31 ENCOUNTER — Ambulatory Visit (HOSPITAL_COMMUNITY): Payer: Medicare Other | Attending: Gastroenterology

## 2014-12-20 ENCOUNTER — Encounter (HOSPITAL_COMMUNITY): Payer: Self-pay | Admitting: Emergency Medicine

## 2014-12-20 ENCOUNTER — Emergency Department (HOSPITAL_COMMUNITY): Payer: Medicare Other

## 2014-12-20 ENCOUNTER — Inpatient Hospital Stay (HOSPITAL_COMMUNITY)
Admission: EM | Admit: 2014-12-20 | Discharge: 2015-01-01 | DRG: 871 | Disposition: A | Discharge status: Hospice | Payer: Medicare Other | Attending: Internal Medicine | Admitting: Internal Medicine

## 2014-12-20 ENCOUNTER — Emergency Department (INDEPENDENT_AMBULATORY_CARE_PROVIDER_SITE_OTHER)
Admission: EM | Admit: 2014-12-20 | Discharge: 2014-12-20 | Disposition: A | Discharge status: Nursing Home (Includes ICF) | Payer: Medicare Other | Source: Home / Self Care

## 2014-12-20 DIAGNOSIS — M1990 Unspecified osteoarthritis, unspecified site: Secondary | ICD-10-CM | POA: Diagnosis not present

## 2014-12-20 DIAGNOSIS — Z888 Allergy status to other drugs, medicaments and biological substances status: Secondary | ICD-10-CM

## 2014-12-20 DIAGNOSIS — E871 Hypo-osmolality and hyponatremia: Secondary | ICD-10-CM | POA: Diagnosis present

## 2014-12-20 DIAGNOSIS — R627 Adult failure to thrive: Secondary | ICD-10-CM | POA: Diagnosis present

## 2014-12-20 DIAGNOSIS — Z23 Encounter for immunization: Secondary | ICD-10-CM | POA: Diagnosis not present

## 2014-12-20 DIAGNOSIS — Z515 Encounter for palliative care: Secondary | ICD-10-CM

## 2014-12-20 DIAGNOSIS — I959 Hypotension, unspecified: Secondary | ICD-10-CM | POA: Diagnosis not present

## 2014-12-20 DIAGNOSIS — K7031 Alcoholic cirrhosis of liver with ascites: Secondary | ICD-10-CM | POA: Diagnosis present

## 2014-12-20 DIAGNOSIS — K567 Ileus, unspecified: Secondary | ICD-10-CM | POA: Diagnosis not present

## 2014-12-20 DIAGNOSIS — J45909 Unspecified asthma, uncomplicated: Secondary | ICD-10-CM | POA: Diagnosis present

## 2014-12-20 DIAGNOSIS — R079 Chest pain, unspecified: Secondary | ICD-10-CM

## 2014-12-20 DIAGNOSIS — R0600 Dyspnea, unspecified: Secondary | ICD-10-CM | POA: Diagnosis not present

## 2014-12-20 DIAGNOSIS — I509 Heart failure, unspecified: Secondary | ICD-10-CM | POA: Diagnosis not present

## 2014-12-20 DIAGNOSIS — Z9842 Cataract extraction status, left eye: Secondary | ICD-10-CM

## 2014-12-20 DIAGNOSIS — Z886 Allergy status to analgesic agent status: Secondary | ICD-10-CM | POA: Diagnosis not present

## 2014-12-20 DIAGNOSIS — K7030 Alcoholic cirrhosis of liver without ascites: Secondary | ICD-10-CM | POA: Diagnosis present

## 2014-12-20 DIAGNOSIS — A09 Infectious gastroenteritis and colitis, unspecified: Secondary | ICD-10-CM | POA: Diagnosis present

## 2014-12-20 DIAGNOSIS — F419 Anxiety disorder, unspecified: Secondary | ICD-10-CM | POA: Diagnosis present

## 2014-12-20 DIAGNOSIS — K7460 Unspecified cirrhosis of liver: Secondary | ICD-10-CM | POA: Diagnosis not present

## 2014-12-20 DIAGNOSIS — I4891 Unspecified atrial fibrillation: Secondary | ICD-10-CM | POA: Diagnosis not present

## 2014-12-20 DIAGNOSIS — R188 Other ascites: Secondary | ICD-10-CM

## 2014-12-20 DIAGNOSIS — N179 Acute kidney failure, unspecified: Secondary | ICD-10-CM | POA: Diagnosis present

## 2014-12-20 DIAGNOSIS — E038 Other specified hypothyroidism: Secondary | ICD-10-CM | POA: Diagnosis present

## 2014-12-20 DIAGNOSIS — Z6821 Body mass index (BMI) 21.0-21.9, adult: Secondary | ICD-10-CM

## 2014-12-20 DIAGNOSIS — D684 Acquired coagulation factor deficiency: Secondary | ICD-10-CM | POA: Diagnosis present

## 2014-12-20 DIAGNOSIS — K219 Gastro-esophageal reflux disease without esophagitis: Secondary | ICD-10-CM | POA: Diagnosis present

## 2014-12-20 DIAGNOSIS — N170 Acute kidney failure with tubular necrosis: Secondary | ICD-10-CM | POA: Diagnosis present

## 2014-12-20 DIAGNOSIS — E860 Dehydration: Secondary | ICD-10-CM | POA: Diagnosis present

## 2014-12-20 DIAGNOSIS — R1084 Generalized abdominal pain: Secondary | ICD-10-CM | POA: Diagnosis present

## 2014-12-20 DIAGNOSIS — K652 Spontaneous bacterial peritonitis: Secondary | ICD-10-CM | POA: Diagnosis present

## 2014-12-20 DIAGNOSIS — N390 Urinary tract infection, site not specified: Secondary | ICD-10-CM | POA: Diagnosis not present

## 2014-12-20 DIAGNOSIS — D509 Iron deficiency anemia, unspecified: Secondary | ICD-10-CM | POA: Diagnosis not present

## 2014-12-20 DIAGNOSIS — Z91018 Allergy to other foods: Secondary | ICD-10-CM | POA: Diagnosis not present

## 2014-12-20 DIAGNOSIS — Z66 Do not resuscitate: Secondary | ICD-10-CM | POA: Diagnosis present

## 2014-12-20 DIAGNOSIS — R6521 Severe sepsis with septic shock: Secondary | ICD-10-CM | POA: Diagnosis not present

## 2014-12-20 DIAGNOSIS — Z452 Encounter for adjustment and management of vascular access device: Secondary | ICD-10-CM | POA: Diagnosis present

## 2014-12-20 DIAGNOSIS — R109 Unspecified abdominal pain: Secondary | ICD-10-CM | POA: Diagnosis present

## 2014-12-20 DIAGNOSIS — E872 Acidosis: Secondary | ICD-10-CM | POA: Diagnosis present

## 2014-12-20 DIAGNOSIS — Z79899 Other long term (current) drug therapy: Secondary | ICD-10-CM

## 2014-12-20 DIAGNOSIS — F17210 Nicotine dependence, cigarettes, uncomplicated: Secondary | ICD-10-CM | POA: Diagnosis present

## 2014-12-20 DIAGNOSIS — N189 Chronic kidney disease, unspecified: Secondary | ICD-10-CM | POA: Diagnosis present

## 2014-12-20 DIAGNOSIS — E039 Hypothyroidism, unspecified: Secondary | ICD-10-CM | POA: Diagnosis present

## 2014-12-20 DIAGNOSIS — Z961 Presence of intraocular lens: Secondary | ICD-10-CM | POA: Diagnosis not present

## 2014-12-20 DIAGNOSIS — E46 Unspecified protein-calorie malnutrition: Secondary | ICD-10-CM | POA: Diagnosis present

## 2014-12-20 DIAGNOSIS — R1013 Epigastric pain: Secondary | ICD-10-CM

## 2014-12-20 DIAGNOSIS — G8929 Other chronic pain: Secondary | ICD-10-CM | POA: Diagnosis present

## 2014-12-20 DIAGNOSIS — J449 Chronic obstructive pulmonary disease, unspecified: Secondary | ICD-10-CM | POA: Diagnosis not present

## 2014-12-20 DIAGNOSIS — R100 Acute abdomen: Secondary | ICD-10-CM | POA: Diagnosis not present

## 2014-12-20 DIAGNOSIS — K767 Hepatorenal syndrome: Secondary | ICD-10-CM | POA: Diagnosis not present

## 2014-12-20 DIAGNOSIS — K8020 Calculus of gallbladder without cholecystitis without obstruction: Secondary | ICD-10-CM | POA: Diagnosis not present

## 2014-12-20 DIAGNOSIS — J9620 Acute and chronic respiratory failure, unspecified whether with hypoxia or hypercapnia: Secondary | ICD-10-CM | POA: Diagnosis present

## 2014-12-20 DIAGNOSIS — Z9841 Cataract extraction status, right eye: Secondary | ICD-10-CM | POA: Diagnosis not present

## 2014-12-20 DIAGNOSIS — R1010 Upper abdominal pain, unspecified: Secondary | ICD-10-CM | POA: Diagnosis not present

## 2014-12-20 DIAGNOSIS — R6520 Severe sepsis without septic shock: Secondary | ICD-10-CM | POA: Diagnosis present

## 2014-12-20 DIAGNOSIS — A419 Sepsis, unspecified organism: Secondary | ICD-10-CM | POA: Diagnosis present

## 2014-12-20 DIAGNOSIS — E876 Hypokalemia: Secondary | ICD-10-CM | POA: Diagnosis present

## 2014-12-20 LAB — CBC
HEMATOCRIT: 26.6 % — AB (ref 36.0–46.0)
HEMOGLOBIN: 8.3 g/dL — AB (ref 12.0–15.0)
MCH: 24.2 pg — AB (ref 26.0–34.0)
MCHC: 31.2 g/dL (ref 30.0–36.0)
MCV: 77.6 fL — AB (ref 78.0–100.0)
Platelets: 255 10*3/uL (ref 150–400)
RBC: 3.43 MIL/uL — AB (ref 3.87–5.11)
RDW: 17.2 % — ABNORMAL HIGH (ref 11.5–15.5)
WBC: 11 10*3/uL — AB (ref 4.0–10.5)

## 2014-12-20 LAB — BASIC METABOLIC PANEL
Anion gap: 10 (ref 5–15)
BUN: 21 mg/dL — ABNORMAL HIGH (ref 6–20)
CHLORIDE: 95 mmol/L — AB (ref 101–111)
CO2: 24 mmol/L (ref 22–32)
CREATININE: 2.3 mg/dL — AB (ref 0.44–1.00)
Calcium: 8.3 mg/dL — ABNORMAL LOW (ref 8.9–10.3)
GFR calc non Af Amer: 21 mL/min — ABNORMAL LOW (ref >60)
GFR, EST AFRICAN AMERICAN: 24 mL/min — AB (ref >60)
Glucose, Bld: 82 mg/dL (ref 65–99)
POTASSIUM: 4.6 mmol/L (ref 3.5–5.1)
SODIUM: 129 mmol/L — AB (ref 135–145)

## 2014-12-20 LAB — I-STAT CG4 LACTIC ACID, ED
LACTIC ACID, VENOUS: 2.43 mmol/L — AB (ref 0.5–2.0)
Lactic Acid, Venous: 2.13 mmol/L (ref 0.5–2.0)

## 2014-12-20 LAB — HEPATIC FUNCTION PANEL
ALBUMIN: 2.5 g/dL — AB (ref 3.5–5.0)
ALK PHOS: 96 U/L (ref 38–126)
ALT: 12 U/L — AB (ref 14–54)
AST: 28 U/L (ref 15–41)
BILIRUBIN INDIRECT: 0.9 mg/dL (ref 0.3–0.9)
Bilirubin, Direct: 0.5 mg/dL (ref 0.1–0.5)
TOTAL PROTEIN: 6.3 g/dL — AB (ref 6.5–8.1)
Total Bilirubin: 1.4 mg/dL — ABNORMAL HIGH (ref 0.3–1.2)

## 2014-12-20 LAB — I-STAT TROPONIN, ED: Troponin i, poc: 0.01 ng/mL (ref 0.00–0.08)

## 2014-12-20 LAB — BRAIN NATRIURETIC PEPTIDE: B Natriuretic Peptide: 1051.1 pg/mL — ABNORMAL HIGH (ref 0.0–100.0)

## 2014-12-20 LAB — LIPASE, BLOOD: Lipase: 36 U/L (ref 11–51)

## 2014-12-20 MED ORDER — SODIUM CHLORIDE 0.9 % IV SOLN
Freq: Once | INTRAVENOUS | Status: AC
  Administered 2014-12-20: 21:00:00 via INTRAVENOUS

## 2014-12-20 MED ORDER — VANCOMYCIN HCL 500 MG IV SOLR
500.0000 mg | INTRAVENOUS | Status: DC
  Filled 2014-12-20: qty 500

## 2014-12-20 MED ORDER — SODIUM CHLORIDE 0.9 % IJ SOLN: 3.0000 mL | Freq: Two times a day (BID) | INTRAMUSCULAR | Status: DC

## 2014-12-20 MED ORDER — HEPARIN SODIUM (PORCINE) 5000 UNIT/ML IJ SOLN
5000.0000 [IU] | Freq: Three times a day (TID) | INTRAMUSCULAR | Status: DC
  Administered 2014-12-20 – 2014-12-21 (×3): 5000 [IU] via SUBCUTANEOUS
  Filled 2014-12-20 (×3): qty 1

## 2014-12-20 MED ORDER — ALBUTEROL SULFATE HFA 108 (90 BASE) MCG/ACT IN AERS
2.0000 | INHALATION_SPRAY | RESPIRATORY_TRACT | Status: DC
  Filled 2014-12-20: qty 6.7

## 2014-12-20 MED ORDER — SODIUM CHLORIDE 0.9 % IV BOLUS (SEPSIS)
250.0000 mL | Freq: Once | INTRAVENOUS | Status: AC
  Administered 2014-12-20: 250 mL via INTRAVENOUS

## 2014-12-20 MED ORDER — IBUPROFEN 200 MG PO TABS: 200.0000 mg | ORAL_TABLET | ORAL | Status: DC | PRN

## 2014-12-20 MED ORDER — SODIUM CHLORIDE 0.9 % IV SOLN
INTRAVENOUS | Status: DC
  Administered 2014-12-20: 75 mL/h via INTRAVENOUS
  Administered 2014-12-21 – 2014-12-22 (×4): via INTRAVENOUS

## 2014-12-20 MED ORDER — PANTOPRAZOLE SODIUM 40 MG PO TBEC: 40.0000 mg | DELAYED_RELEASE_TABLET | Freq: Every day | ORAL | Status: DC

## 2014-12-20 MED ORDER — SODIUM CHLORIDE 0.9 % IJ SOLN: 3.0000 mL | INTRAMUSCULAR | Status: DC | PRN

## 2014-12-20 MED ORDER — SODIUM CHLORIDE 0.9 % IJ SOLN
INTRAMUSCULAR | Status: AC
  Filled 2014-12-20: qty 10

## 2014-12-20 MED ORDER — ALPRAZOLAM 0.5 MG PO TABS
0.5000 mg | ORAL_TABLET | Freq: Four times a day (QID) | ORAL | Status: DC | PRN
  Administered 2014-12-22 – 2014-12-26 (×6): 0.5 mg via ORAL
  Filled 2014-12-20 (×7): qty 1

## 2014-12-20 MED ORDER — SODIUM CHLORIDE 0.9 % IV SOLN: 250.0000 mL | INTRAVENOUS | Status: DC | PRN

## 2014-12-20 MED ORDER — PIPERACILLIN-TAZOBACTAM 3.375 G IVPB
3.3750 g | Freq: Three times a day (TID) | INTRAVENOUS | Status: DC
  Administered 2014-12-20 – 2014-12-21 (×4): 3.375 g via INTRAVENOUS
  Filled 2014-12-20 (×11): qty 50

## 2014-12-20 MED ORDER — VITAMIN B-1 100 MG PO TABS: 100.0000 mg | ORAL_TABLET | Freq: Every day | ORAL | Status: DC

## 2014-12-20 MED ORDER — POLYETHYLENE GLYCOL 3350 17 G PO PACK
17.0000 g | PACK | Freq: Every day | ORAL | Status: DC
  Administered 2014-12-20: 17 g via ORAL
  Filled 2014-12-20: qty 1

## 2014-12-20 MED ORDER — FENTANYL CITRATE (PF) 100 MCG/2ML IJ SOLN
50.0000 ug | Freq: Once | INTRAMUSCULAR | Status: AC
  Administered 2014-12-20: 50 ug via INTRAVENOUS
  Filled 2014-12-20: qty 2

## 2014-12-20 MED ORDER — FOLIC ACID 1 MG PO TABS
1.0000 mg | ORAL_TABLET | Freq: Every day | ORAL | Status: DC
  Administered 2014-12-22 – 2014-12-31 (×9): 1 mg via ORAL
  Filled 2014-12-20 (×11): qty 1

## 2014-12-20 MED ORDER — ALBUTEROL SULFATE (2.5 MG/3ML) 0.083% IN NEBU: 3.0000 mL | INHALATION_SOLUTION | RESPIRATORY_TRACT | Status: DC

## 2014-12-20 MED ORDER — IPRATROPIUM-ALBUTEROL 0.5-2.5 (3) MG/3ML IN SOLN
3.0000 mL | RESPIRATORY_TRACT | Status: DC
  Administered 2014-12-20 – 2014-12-21 (×5): 3 mL via RESPIRATORY_TRACT
  Filled 2014-12-20 (×5): qty 3

## 2014-12-20 MED ORDER — TRAZODONE HCL 50 MG PO TABS
50.0000 mg | ORAL_TABLET | Freq: Every day | ORAL | Status: DC
  Administered 2014-12-20: 100 mg via ORAL
  Filled 2014-12-20: qty 2

## 2014-12-20 MED ORDER — MORPHINE SULFATE (PF) 2 MG/ML IV SOLN
2.0000 mg | INTRAVENOUS | Status: DC | PRN
Start: 2014-12-20 — End: 2014-12-21
  Administered 2014-12-20 – 2014-12-21 (×2): 2 mg via INTRAVENOUS
  Administered 2014-12-21: 4 mg via INTRAVENOUS
  Filled 2014-12-20 (×2): qty 1
  Filled 2014-12-20: qty 2

## 2014-12-20 MED ORDER — PANTOPRAZOLE SODIUM 40 MG PO TBEC: 80.0000 mg | DELAYED_RELEASE_TABLET | Freq: Every day | ORAL | Status: DC

## 2014-12-20 NOTE — ED Notes (Addendum)
[facility] a [facility] of chest and upper abdominal pain that has been occurring for the past two months. The patient described the chest pain as stabbing that does worsen upon inspiration and palpation. The patient stated that the pain does lead to SOB. The patient also reported lower extremity edema.

## 2014-12-20 NOTE — ED Notes (Signed)
Pt to receive zosyn as first antibiotic per Dr.Gardner

## 2014-12-20 NOTE — ED Provider Notes (Addendum)
CSN: [id number]     Arrival date & time 12/20/14  1329 History   None    Chief Complaint  Patient presents with  . Chest Pain  . Abdominal Pain   (Consider location/radiation/quality/duration/timing/severity/associated sxs/prior Treatment) Patient is a 64 y.o. female presenting with abdominal pain and chest pain. The history is provided by the patient. No language interpreter was used.  Abdominal Pain Pain location:  Epigastric Pain quality: aching and pressure   Pain quality comment:  Patient stated she had acid reflux in the past but the pain is different now Pain radiates to:  RUQ and chest Pain severity:  Severe (Pain is 10/10 in severity) Onset quality:  Sudden Duration:  8 weeks Timing:  Constant Progression:  Worsening Chronicity:  New Context: not alcohol use, not diet changes, not laxative use, not previous surgeries, not sick contacts, not suspicious food intake and not trauma   Context comment:  She stays constipated, usually uses Laxative to use the bathroom. She also has Liver Cirrhosis Worsened by:  Movement, deep breathing and eating Ineffective treatments:  None tried Associated symptoms: chest pain, constipation and fatigue   Associated symptoms: no anorexia, no cough, no diarrhea, no fever, no hematemesis, no hematochezia, no melena, no nausea and no vomiting   Associated symptoms comment:  Feels weak and tired Risk factors: no alcohol abuse and no recent hospitalization   Chest Pain Pain location:  Epigastric Pain quality: aching   Pain radiates to the back: no   Associated symptoms: abdominal pain and fatigue   Associated symptoms: no altered mental status, no anorexia, no anxiety, no cough, no fever, no nausea and not vomiting     Past Medical History  Diagnosis Date  . CHF (congestive heart failure) (HCC)   . Cirrhosis (HCC)   . COPD (chronic obstructive pulmonary disease) (HCC)   . Chronic kidney disease     "from cirrhosis of the liver"  . Anxiety    . Arthritis   . GERD (gastroesophageal reflux disease)   . Asthma   . Migraines 02/28/11    "I have bad migraines"  . Ascitic fluid    Past Surgical History  Procedure Laterality Date  . Cataract extraction w/ intraocular lens  implant, bilateral  2011  . Appendectomy  1965   History reviewed. No pertinent family history. Social History  Substance Use Topics  . Smoking status: Current Every Day Smoker -- 1.00 packs/day for 45 years    Types: Cigarettes  . Smokeless tobacco: Never Used  . Alcohol Use: 21.0 oz/week    35 Cans of beer per week   OB History    No data available     Review of Systems  Constitutional: Positive for fatigue. Negative for fever.  Respiratory: Negative.  Negative for cough.   Cardiovascular: Positive for chest pain.  Gastrointestinal: Positive for abdominal pain, constipation and abdominal distention. Negative for nausea, vomiting, diarrhea, melena, hematochezia, anorexia and hematemesis.  Genitourinary: Negative.   All other systems reviewed and are negative.   Allergies  Tylenol; Aspirin; Cortisone; and Tea  Home Medications   Prior to Admission medications   Medication Sig Start Date End Date Taking? Authorizing Provider  albuterol (PROVENTIL HFA;VENTOLIN HFA) 108 (90 BASE) MCG/ACT inhaler Inhale 2 puffs into the lungs every 4 (four) hours. Scheduled    Historical Provider, MD  ALPRAZolam Prudy Feeler(XANAX) 0.5 MG tablet Take 0.5 mg by mouth 4 (four) times daily as needed.     Historical Provider, MD  esomeprazole (NEXIUM)  40 MG capsule Take 40 mg by mouth daily before breakfast.      Historical Provider, MD  folic acid (FOLVITE) 1 MG tablet Take 1 mg by mouth daily.    Historical Provider, MD  furosemide (LASIX) 20 MG tablet Take 20 mg by mouth daily.      Historical Provider, MD  pantoprazole (PROTONIX) 40 MG tablet Take 40 mg by mouth daily.    Historical Provider, MD  polyethylene glycol (MIRALAX / GLYCOLAX) packet Take 17 g by mouth daily.     Historical Provider, MD  propranolol (INDERAL) 10 MG tablet Take 10 mg by mouth daily.      Historical Provider, MD  spironolactone (ALDACTONE) 25 MG tablet Take 25 mg by mouth daily.      Historical Provider, MD  thiamine (VITAMIN B-1) 100 MG tablet Take 100 mg by mouth daily.    Historical Provider, MD  traMADol (ULTRAM) 50 MG tablet Take 50 mg by mouth 3 (three) times daily. Maximum dose= 8 tablets per day     Historical Provider, MD  traZODone (DESYREL) 50 MG tablet Take 50-100 mg by mouth at bedtime.    Historical Provider, MD   Meds Ordered and Administered this Visit  Medications - No data to display  BP 87/43 mmHg  Pulse 88  Temp(Src) 99.3 F (37.4 C)  Resp 28  SpO2 100% No data found.   Physical Exam  Constitutional: She is oriented to person, place, and time. She appears well-developed. She appears distressed.  Mild distress due to pain  Cardiovascular: Normal rate, regular rhythm and normal heart sounds.   No murmur heard. Pulmonary/Chest: Effort normal and breath sounds normal. No respiratory distress. She has no wheezes.  Abdominal: Soft. Bowel sounds are normal. She exhibits distension. She exhibits no mass. There is tenderness in the epigastric area. There is no rebound and no guarding.    Musculoskeletal: She exhibits edema.  ++ B/L pedal edema  Neurological: She is alert and oriented to person, place, and time.  Nursing note and vitals reviewed.   ED Course  Procedures (including critical care time)  Labs Review Labs Reviewed - No data to display  Imaging Review No results found.   Visual Acuity Review  Right Eye Distance:   Left Eye Distance:   Bilateral Distance:    Right Eye Near:   Left Eye Near:    Bilateral Near:     EKG: atrial fibrillation, rate 85.     MDM  No diagnosis found. Epigastric pain Liver cirrhosis. Atypical chest pain New onset Afib Hypotension  Epigastric and chest pain likely related to GI pathology especially  in the setting of Liver cirrhosis and ascites. As discussed with patient I will recommend CT abdomen which she will be able to get at the ED.  She also has Afib on EKG with normal rate. Per patient she has not had this diagnosis in the past and not on treatment for it. She will benefit from cardiac work-up in the hospital. BP low as well, likely part of her current illness vs dehydration. Patient agreed to go to the ED for full assessment and possibly get admitted to the hospital. Transferred to the ED via carelink.    Doreene Eland, MD 12/20/14 1515  Doreene Eland, MD 12/20/14 [phone]

## 2014-12-20 NOTE — ED Provider Notes (Signed)
I saw and evaluated the patient, reviewed the resident's note and I agree with the findings and plan.   EKG Interpretation   Date/Time:  Sunday December 20 2014 15:42:02 EDT Ventricular Rate:  86 PR Interval:  169 QRS Duration: 86 QT Interval:  395 QTC Calculation: 472 R Axis:   55 Text Interpretation:  Sinus rhythm Low voltage, precordial leads Artifact  No significant change since last tracing Confirmed by Jeanpierre Thebeau  MD, Audwin Semper  [id number]) on 12/20/2014 3:51:38 PM      Results for orders placed or performed during the hospital encounter of 12/20/14  Basic metabolic panel  Result Value Ref Range   Sodium 129 (L) 135 - 145 mmol/L   Potassium 4.6 3.5 - 5.1 mmol/L   Chloride 95 (L) 101 - 111 mmol/L   CO2 24 22 - 32 mmol/L   Glucose, Bld 82 65 - 99 mg/dL   BUN 21 (H) 6 - 20 mg/dL   Creatinine, Ser 1.91 (H) 0.44 - 1.00 mg/dL   Calcium 8.3 (L) 8.9 - 10.3 mg/dL   GFR calc non Af Amer 21 (L) >60 mL/min   GFR calc Af Amer 24 (L) >60 mL/min   Anion gap 10 5 - 15  CBC  Result Value Ref Range   WBC 11.0 (H) 4.0 - 10.5 K/uL   RBC 3.43 (L) 3.87 - 5.11 MIL/uL   Hemoglobin 8.3 (L) 12.0 - 15.0 g/dL   HCT 47.8 (L) 29.5 - 62.1 %   MCV 77.6 (L) 78.0 - 100.0 fL   MCH 24.2 (L) 26.0 - 34.0 pg   MCHC 31.2 30.0 - 36.0 g/dL   RDW 30.8 (H) 65.7 - 84.6 %   Platelets 255 150 - 400 K/uL  Brain natriuretic peptide  Result Value Ref Range   B Natriuretic Peptide 1051.1 (H) 0.0 - 100.0 pg/mL  Hepatic function panel  Result Value Ref Range   Total Protein 6.3 (L) 6.5 - 8.1 g/dL   Albumin 2.5 (L) 3.5 - 5.0 g/dL   AST 28 15 - 41 U/L   ALT 12 (L) 14 - 54 U/L   Alkaline Phosphatase 96 38 - 126 U/L   Total Bilirubin 1.4 (H) 0.3 - 1.2 mg/dL   Bilirubin, Direct 0.5 0.1 - 0.5 mg/dL   Indirect Bilirubin 0.9 0.3 - 0.9 mg/dL  Lipase, blood  Result Value Ref Range   Lipase 36 11 - 51 U/L  I-stat troponin, ED  Result Value Ref Range   Troponin i, poc 0.01 0.00 - 0.08 ng/mL   Comment 3          I-Stat CG4  Lactic Acid, ED  Result Value Ref Range   Lactic Acid, Venous 2.13 (HH) 0.5 - 2.0 mmol/L   Comment NOTIFIED PHYSICIAN   I-Stat CG4 Lactic Acid, ED  Result Value Ref Range   Lactic Acid, Venous 2.43 (HH) 0.5 - 2.0 mmol/L   Comment NOTIFIED PHYSICIAN    Dg Chest 2 View  12/20/2014  CLINICAL DATA:  Midline chest pain shortness of breath for 2 months EXAM: CHEST - 2 VIEW COMPARISON:  12/15/2013 FINDINGS: Cardiac shadow is within normal limits. Minimal atelectatic changes are noted in the left lung base. No focal infiltrate or sizable effusion is seen. No bony abnormality is noted. IMPRESSION: Minimal left basilar atelectasis. Electronically Signed   By: Alcide Clever M.D.   On: 12/20/2014 16:37   US Abdomen Complete  12/20/2014  CLINICAL DATA:  Abdominal pain EXAM: ULTRASOUND ABDOMEN COMPLETE COMPARISON:  01/19/2014  FINDINGS: Gallbladder: Multiple gallstones are again identified. No significant gallbladder wall thickening is noted. There is a positive sonographic Murphy sign however. Common bile duct: Diameter: 5 mm Liver: Mild heterogeneity as well as nodularity is noted likely representing underlying cirrhotic change IVC: No abnormality visualized. Pancreas: Visualized portion unremarkable. Spleen: Size and appearance within normal limits. Right Kidney: Length: 8.8.  Mild cortical thinning is noted. Left Kidney: Length: 8.5.  Mild cortical thinning is noted. Abdominal aorta: No aneurysm visualized. Other findings: None. IMPRESSION: Changes of hepatic cirrhosis. Multiple gallstones with positive sonographic Murphy's sign. No wall thickening is noted. Mild cortical thinning of the kidneys bilaterally. Electronically Signed   By: Alcide CleverMark  Lukens M.D.   On: 12/20/2014 19:17    Patient sent here from urgent care for substernal chest pain. That radiates into the epigastric and upper quadrant area. Patient with known history of gallstones have not is a candidate to have them removed due to her cirrhosis. Workup  for the chest pain without evidence of any acute cardiac event troponin was negative. EKG without any acute changes. Patient's symptoms been present for weeks to months.  Upon arrival patient was noted to have her general blood pressures in the upper 80s low 90s was not tachycardic but she is on a beta blocker. Patient was seen in urgent care for the epigastric upper quadrant abdominal pain and for that chest pain. Clinically she appeared dehydrated. No fever involved. Chronically did not feel that this was related to sepsis but more to dehydration. BUN and creatinine were elevated. Patient given IV fluids with gradual improvement in the blood pressure now onto the low 100s. In addition patient stated that her blood pressures are normally low that usually at best they're around the 100 and she told me that quite often they are around 90. Heart rate also improved with fluids. Repeat lactic acid showed elevation from 2.13-2.43. May actually be within the range of normal for the lab test however based on this being persistent and not coming down after the fluids we'll go ahead and start the broad-spectrum antibiotics in case there is a septic picture. Clinically patient is showing significant improvement. Ultrasound of the abdomen showed no evidence of acute cholecystitis. Patient only had a slight elevation in her white blood cell count. Urinalysis is still pending that is a potential source of infection. Chest x-ray was negative for pneumonia. Patient's lipase was not elevated. No significant abnormalities in her liver function tests. Except for bilirubin slightly up at 1.4. But again patient is known to have a history of cirrhosis.  Still feel clinically that patient is not septic certainly patient has shown signs of improvement without antibiotics to this point.  Patient will be admitted by hospitalist team.  Vanetta MuldersScott Daeshawn Redmann, MD 12/20/14 2138

## 2014-12-20 NOTE — ED Notes (Signed)
EMS called and truck on the way

## 2014-12-20 NOTE — Progress Notes (Signed)
ANTIBIOTIC CONSULT NOTE - INITIAL  Pharmacy Consult for vancomycin and zosyn Indication: rule out sepsis  Allergies  Allergen Reactions  . Tylenol [Acetaminophen] Other (See Comments)    Can not take with tramadol-not allergic  . Aspirin Rash  . Cortisone Rash and Other (See Comments)    Red spots.  . Tea Rash    Patient Measurements:   Adjusted Body Weight:   Vital Signs: Temp: 98.2 F (36.8 C) (10/23 1543) Temp Source: Oral (10/23 1543) BP: 91/46 mmHg (10/23 1945) Pulse Rate: 56 (10/23 1945) Intake/Output from previous day:   Intake/Output from this shift:    Labs:  Recent Labs  12/20/14 1600  WBC 11.0*  HGB 8.3*  PLT 255  CREATININE 2.30*   CrCl cannot be calculated (Unknown ideal weight.). No results for input(s): VANCOTROUGH, VANCOPEAK, VANCORANDOM, GENTTROUGH, GENTPEAK, GENTRANDOM, TOBRATROUGH, TOBRAPEAK, TOBRARND, AMIKACINPEAK, AMIKACINTROU, AMIKACIN in the last 72 hours.   Microbiology: No results found for this or any previous visit (from the past 720 hour(s)).  Medical History: Past Medical History  Diagnosis Date  . CHF (congestive heart failure) (HCC)   . Cirrhosis (HCC)   . COPD (chronic obstructive pulmonary disease) (HCC)   . Chronic kidney disease     "from cirrhosis of the liver"  . Anxiety   . Arthritis   . GERD (gastroesophageal reflux disease)   . Asthma   . Migraines 02/28/11    "I have bad migraines"  . Ascitic fluid     Medications:  Scheduled:  . ipratropium-albuterol  3 mL Nebulization Q4H   Infusions:   Assessment: 64 yo female with r/o sepis will be started on vancomycin and zosyn.  Patient has CKD with SCr of 2.3  Goal of Therapy:  Vancomycin trough level 15-20 mcg/ml  Plan:  - vancomycin 500 mg iv q24h - zosyn 3.375 g iv q8h (4h infusion) - monitor renal function closely - check vancomycin trough when it's appropriate  Cassi Jenne, Tsz-Yin 12/20/2014,9:35 PM

## 2014-12-20 NOTE — H&P (Signed)
[facility] History and Physical  Abigail Crawford ZOX:[id number] DOB: January 03, 1951 DOA: 12/20/2014  Referring physician: EDP PCP: Lilia ArgueKAPLAN,KRISTEN, PA-C   Chief Complaint: Abdominal pain   HPI: Abigail Crawford is a 64 y.o. 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  Patient presents to the ED with 2 month history of chest and abdominal pain.  Pain is substernal and radiates all the way down to periumbilical region in her abdomen especially on the R side.  Symptoms began 2 months ago.  Pain is sharp and she reports "it feels like someone is stabbing her with a knife".  Worse with deep inspiration and movement.  No N/V or fevers.  Hasnt had trouble with abdominal swelling due to diet and diuretic control of her ascites, though she has had to have these drained in the past some years ago.  Review of Systems: Systems reviewed.  As above, otherwise negative  Past Medical History  Diagnosis Date  . CHF (congestive heart failure) (HCC)   . Cirrhosis (HCC)   . COPD (chronic obstructive pulmonary disease) (HCC)   . Chronic kidney disease     "from cirrhosis of the liver"  . Anxiety   . Arthritis   . GERD (gastroesophageal reflux disease)   . Asthma   . Migraines 02/28/11    "I have bad migraines"  . Ascitic fluid    Past Surgical History  Procedure Laterality Date  . Cataract extraction w/ intraocular lens  implant, bilateral  2011  . Appendectomy  1965   Social History:  reports that she has been smoking Cigarettes.  She has a 45 pack-year smoking history. She has never used smokeless tobacco. She reports that she drinks about 21.0 oz of alcohol per week. She reports that she does not use illicit drugs.  Allergies  Allergen Reactions  . Tylenol [Acetaminophen] Other (See Comments)    Can not take with tramadol-not allergic  . Aspirin Rash  . Cortisone Rash and Other (See Comments)    Red spots.  . Tea Rash    History reviewed. No pertinent family history.    Prior to Admission medications   Medication Sig Start Date End Date Taking? Authorizing Provider  albuterol (PROVENTIL HFA;VENTOLIN HFA) 108 (90 BASE) MCG/ACT inhaler Inhale 2 puffs into the lungs every 4 (four) hours. Scheduled   Yes Historical Provider, MD  ALPRAZolam (XANAX) 0.5 MG tablet Take 0.5 mg by mouth 4 (four) times daily as needed for anxiety or sleep.    Yes Historical Provider, MD  esomeprazole (NEXIUM) 40 MG capsule Take 40 mg by mouth daily before breakfast.     Yes Historical Provider, MD  folic acid (FOLVITE) 1 MG tablet Take 1 mg by mouth daily.   Yes Historical Provider, MD  furosemide (LASIX) 20 MG tablet Take 20 mg by mouth daily.     Yes Historical Provider, MD  ibuprofen (ADVIL,MOTRIN) 200 MG tablet Take 200 mg by mouth every 4 (four) hours as needed for moderate pain.    Yes Historical Provider, MD  pantoprazole (PROTONIX) 40 MG tablet Take 40 mg by mouth daily.   Yes Historical Provider, MD  polyethylene glycol (MIRALAX / GLYCOLAX) packet Take 17 g by mouth at bedtime.    Yes Historical Provider, MD  propranolol (INDERAL) 10 MG tablet Take 10 mg by mouth daily.     Yes Historical Provider, MD  spironolactone (ALDACTONE) 25 MG tablet Take 25 mg by mouth daily.     Yes Historical  Provider, MD  thiamine (VITAMIN B-1) 100 MG tablet Take 100 mg by mouth daily.   Yes Historical Provider, MD  traMADol (ULTRAM) 50 MG tablet Take 50 mg by mouth every 6 (six) hours. Maximum dose= 8 tablets per day   Yes Historical Provider, MD  traZODone (DESYREL) 50 MG tablet Take 50-100 mg by mouth at bedtime.   Yes Historical Provider, MD   Physical Exam: Filed Vitals:   12/20/14 2207  BP: 92/52  Pulse: 80  Temp: 98.2 F (36.8 C)  Resp: 20    BP 92/52 mmHg  Pulse 80  Temp(Src) 98.2 F (36.8 C) (Oral)  Resp 20  Ht  (1.6 m)  Wt 53.071 kg (117 lb)  BMI 20.73 kg/m2  SpO2 98%  General Appearance:    Alert, oriented, no distress, appears stated age  Head:    Normocephalic,  atraumatic  Eyes:    PERRL, EOMI, sclera non-icteric        Nose:   Nares without drainage or epistaxis. Mucosa, turbinates normal  Throat:   Moist mucous membranes. Oropharynx without erythema or exudate.  Neck:   Supple. No carotid bruits.  No thyromegaly.  No lymphadenopathy.   Back:     No CVA tenderness, no spinal tenderness  Lungs:     Clear to auscultation bilaterally, without wheezes, rhonchi or rales  Chest wall:    No tenderness to palpitation  Heart:    Regular rate and rhythm without murmurs, gallops, rubs  Abdomen:     Tender with guarding and rebound R side > left side. nondistended, normal bowel sounds, no organomegaly  Genitalia:    deferred  Rectal:    deferred  Extremities:   No clubbing, cyanosis or edema.  Pulses:   2+ and symmetric all extremities  Skin:   Skin color, texture, turgor normal, no rashes or lesions  Lymph nodes:   Cervical, supraclavicular, and axillary nodes normal  Neurologic:   CNII-XII intact. Normal strength, sensation and reflexes      throughout    Labs on Admission:  Basic Metabolic Panel:  Recent Labs Lab 12/20/14 1600  NA 129*  K 4.6  CL 95*  CO2 24  GLUCOSE 82  BUN 21*  CREATININE 2.30*  CALCIUM 8.3*   Liver Function Tests:  Recent Labs Lab 12/20/14 1600  AST 28  ALT 12*  ALKPHOS 96  BILITOT 1.4*  PROT 6.3*  ALBUMIN 2.5*    Recent Labs Lab 12/20/14 1600  LIPASE 36   No results for input(s): AMMONIA in the last 168 hours. CBC:  Recent Labs Lab 12/20/14 1600  WBC 11.0*  HGB 8.3*  HCT 26.6*  MCV 77.6*  PLT 255   Cardiac Enzymes: No results for input(s): CKTOTAL, CKMB, CKMBINDEX, TROPONINI in the last 168 hours.  BNP (last 3 results) No results for input(s): PROBNP in the last 8760 hours. CBG: No results for input(s): GLUCAP in the last 168 hours.  Radiological Exams on Admission: Dg Chest 2 View  12/20/2014  CLINICAL DATA:  Midline chest pain shortness of breath for 2 months EXAM: CHEST - 2 VIEW  COMPARISON:  12/15/2013 FINDINGS: Cardiac shadow is within normal limits. Minimal atelectatic changes are noted in the left lung base. No focal infiltrate or sizable effusion is seen. No bony abnormality is noted. IMPRESSION: Minimal left basilar atelectasis. Electronically Signed   By: Alcide Clever M.D.   On: 12/20/2014 16:37   US Abdomen Complete  12/20/2014  CLINICAL DATA:  Abdominal pain  EXAM: ULTRASOUND ABDOMEN COMPLETE COMPARISON:  01/19/2014 FINDINGS: Gallbladder: Multiple gallstones are again identified. No significant gallbladder wall thickening is noted. There is a positive sonographic Murphy sign however. Common bile duct: Diameter: 5 mm Liver: Mild heterogeneity as well as nodularity is noted likely representing underlying cirrhotic change IVC: No abnormality visualized. Pancreas: Visualized portion unremarkable. Spleen: Size and appearance within normal limits. Right Kidney: Length: 8.8.  Mild cortical thinning is noted. Left Kidney: Length: 8.5.  Mild cortical thinning is noted. Abdominal aorta: No aneurysm visualized. Other findings: None. IMPRESSION: Changes of hepatic cirrhosis. Multiple gallstones with positive sonographic Murphy's sign. No wall thickening is noted. Mild cortical thinning of the kidneys bilaterally. Electronically Signed   By: Alcide Clever M.D.   On: 12/20/2014 19:17    EKG: Independently reviewed.  Assessment/Plan Principal Problem:   Abdominal pain Active Problems:   Hyponatremia   Liver cirrhosis (HCC)   Hypotension   AKI (acute kidney injury) (HCC)   1. Abd pain - most suspicious for SBP given her history but other etiologies need to be ruled out on CT scan 1. CT abd pelvis w/o contrast (due to AKI today) 2. Morphine PRN pain 3. Empiric zosyn and vanc ordered in ED, will continue the empiric zosyn for now and hold / stop the vanc 4. Depending on result of CT abd/pelvis may need IR order for paracentesis. 5. Repeat CBC in AM to trend leukocytosis, no  other SIRS criteria at this point. 2. AKI - likely pre-renal due to hypotension 1. Repeat BMP in AM 2. Treat hypotension as below 3. Hypotension - hold BP meds, hold lasix and spironolactone, improved from SBPs in the 70s to the 90s already in ED after IVF, will continue to gently hydrate (patient states she often will run low 100s at most). 4. Cirrhosis of liver - chronic and stable    Code Status: Full  Family Communication: No family in room Disposition Plan: Admit to inpatient   Time spent: 70 min  Angela Vazguez M. [facility] Pager [phone]  If 7AM-7PM, please contact the day team taking care of the patient [email or web address] Password [email or web address] 12/20/2014, 10:18 PM

## 2014-12-20 NOTE — ED Notes (Signed)
Dr. Gardner at bedside 

## 2014-12-20 NOTE — ED Notes (Signed)
Pt in US

## 2014-12-20 NOTE — ED Provider Notes (Signed)
CSN: [id number]     Arrival date & time 12/20/14  1534 History   First MD Initiated Contact with Patient 12/20/14 1558     Chief Complaint  Patient presents with  . Chest Pain   (Consider location/radiation/quality/duration/timing/severity/associated sxs/prior Treatment) HPI Comments: [name] is a 64 year old female with a history of alcoholic liver cirrhosis, CHF, COPD and CKD presents to the ED from Urgent Care with 2 month history of chest and abdominal pain. She reports that she has persistent substernal chest pain that radiates down to her epigastric region and RUQ that began 2 months ago. The pain is sharp and she reports it "feels like someone is stabbing me with a knife." Pain is worse with deep inspiration and movement. Denies any nausea or vomiting or fevers. Does report occasional chills. Does report some shortness of breath and wheezing and requires her albuterol inhaler 6-8 times a day with symptoms getting progressively worse over the past 2 months. Does note some mild leg edema.   She reports her blood pressures at home are usually 100s/40-60s and sometime lower. She has a known history of gallstones but is not a surgical candidate due to her liver cirrhosis.   The history is provided by the patient.    Past Medical History  Diagnosis Date  . CHF (congestive heart failure) (HCC)   . Cirrhosis (HCC)   . COPD (chronic obstructive pulmonary disease) (HCC)   . Chronic kidney disease     "from cirrhosis of the liver"  . Anxiety   . Arthritis   . GERD (gastroesophageal reflux disease)   . Asthma   . Migraines 02/28/11    "I have bad migraines"  . Ascitic fluid    Past Surgical History  Procedure Laterality Date  . Cataract extraction w/ intraocular lens  implant, bilateral  2011  . Appendectomy  1965   History reviewed. No pertinent family history. Social History  Substance Use Topics  . Smoking status: Current Every Day Smoker -- 1.00 packs/day for 45 years    Types:  Cigarettes  . Smokeless tobacco: Never Used  . Alcohol Use: 21.0 oz/week    35 Cans of beer per week   OB History    No data available     Review of Systems  Constitutional: Positive for chills. Negative for fever and fatigue.  HENT: Negative.   Eyes: Negative.   Respiratory: Positive for shortness of breath and wheezing. Negative for cough and chest tightness.   Cardiovascular: Positive for chest pain and leg swelling. Negative for palpitations.  Gastrointestinal: Positive for abdominal pain and abdominal distention. Negative for nausea, vomiting, diarrhea, constipation and blood in stool.  Endocrine: Negative.   Genitourinary: Negative.   Musculoskeletal: Negative.   Skin: Negative.   Allergic/Immunologic: Negative.   Neurological: Positive for dizziness and light-headedness. Negative for numbness and headaches.  Hematological: Negative.   Psychiatric/Behavioral: Negative.    Allergies  Tylenol; Aspirin; Cortisone; and Tea  Home Medications   Prior to Admission medications   Medication Sig Start Date End Date Taking? Authorizing Provider  albuterol (PROVENTIL HFA;VENTOLIN HFA) 108 (90 BASE) MCG/ACT inhaler Inhale 2 puffs into the lungs every 4 (four) hours. Scheduled   Yes Historical Provider, MD  ALPRAZolam (XANAX) 0.5 MG tablet Take 0.5 mg by mouth 4 (four) times daily as needed for anxiety or sleep.    Yes Historical Provider, MD  esomeprazole (NEXIUM) 40 MG capsule Take 40 mg by mouth daily before breakfast.  Yes Historical Provider, MD  folic acid (FOLVITE) 1 MG tablet Take 1 mg by mouth daily.   Yes Historical Provider, MD  furosemide (LASIX) 20 MG tablet Take 20 mg by mouth daily.     Yes Historical Provider, MD  ibuprofen (ADVIL,MOTRIN) 200 MG tablet Take 200 mg by mouth every 4 (four) hours as needed for moderate pain.    Yes Historical Provider, MD  pantoprazole (PROTONIX) 40 MG tablet Take 40 mg by mouth daily.   Yes Historical Provider, MD  polyethylene glycol  (MIRALAX / GLYCOLAX) packet Take 17 g by mouth at bedtime.    Yes Historical Provider, MD  propranolol (INDERAL) 10 MG tablet Take 10 mg by mouth daily.     Yes Historical Provider, MD  spironolactone (ALDACTONE) 25 MG tablet Take 25 mg by mouth daily.     Yes Historical Provider, MD  thiamine (VITAMIN B-1) 100 MG tablet Take 100 mg by mouth daily.   Yes Historical Provider, MD  traMADol (ULTRAM) 50 MG tablet Take 50 mg by mouth every 6 (six) hours. Maximum dose= 8 tablets per day   Yes Historical Provider, MD  traZODone (DESYREL) 50 MG tablet Take 50-100 mg by mouth at bedtime.   Yes Historical Provider, MD   BP 92/52 mmHg  Pulse 80  Temp(Src) 98.2 F (36.8 C) (Oral)  Resp 20  Ht  (1.6 m)  Wt 117 lb (53.071 kg)  BMI 20.73 kg/m2  SpO2 98% Physical Exam  Constitutional: She is oriented to person, place, and time. She appears well-developed. She appears cachectic. She is cooperative.  Non-toxic appearance.  HENT:  Head: Normocephalic and atraumatic.  Mouth/Throat: No oropharyngeal exudate.  Eyes: Conjunctivae and EOM are normal. Pupils are equal, round, and reactive to light.  Neck: Normal range of motion. Neck supple.  Cardiovascular: Normal rate, regular rhythm and normal heart sounds.  Exam reveals no gallop and no friction rub.   No murmur heard. Pulmonary/Chest: No respiratory distress. She has no decreased breath sounds. She has wheezes. She has no rhonchi. She has no rales.  Abdominal: Soft. Normal appearance and bowel sounds are normal. She exhibits no fluid wave and no ascites. There is generalized tenderness. There is rebound, guarding and positive Murphy's sign. There is no CVA tenderness.  Musculoskeletal: Normal range of motion.  Neurological: She is alert and oriented to person, place, and time.  Skin: Skin is warm and dry. No rash noted. No erythema.  Psychiatric: She has a normal mood and affect.    ED Course  Procedures (including critical care time) Labs  Review Labs Reviewed  BASIC METABOLIC PANEL - Abnormal; Notable for the following:    Sodium 129 (*)    Chloride 95 (*)    BUN 21 (*)    Creatinine, Ser 2.30 (*)    Calcium 8.3 (*)    GFR calc non Af Amer 21 (*)    GFR calc Af Amer 24 (*)    All other components within normal limits  CBC - Abnormal; Notable for the following:    WBC 11.0 (*)    RBC 3.43 (*)    Hemoglobin 8.3 (*)    HCT 26.6 (*)    MCV 77.6 (*)    MCH 24.2 (*)    RDW 17.2 (*)    All other components within normal limits  BRAIN NATRIURETIC PEPTIDE - Abnormal; Notable for the following:    B Natriuretic Peptide 1051.1 (*)    All other components within normal  limits  HEPATIC FUNCTION PANEL - Abnormal; Notable for the following:    Total Protein 6.3 (*)    Albumin 2.5 (*)    ALT 12 (*)    Total Bilirubin 1.4 (*)    All other components within normal limits  I-STAT CG4 LACTIC ACID, ED - Abnormal; Notable for the following:    Lactic Acid, Venous 2.13 (*)    All other components within normal limits  I-STAT CG4 LACTIC ACID, ED - Abnormal; Notable for the following:    Lactic Acid, Venous 2.43 (*)    All other components within normal limits  CULTURE, BLOOD (ROUTINE X 2)  CULTURE, BLOOD (ROUTINE X 2)  LIPASE, BLOOD  URINALYSIS, ROUTINE W REFLEX MICROSCOPIC (NOT AT [facility])  CBC  COMPREHENSIVE METABOLIC PANEL  I-STAT TROPOININ, ED    Imaging Review Dg Chest 2 View  12/20/2014  CLINICAL DATA:  Midline chest pain shortness of breath for 2 months EXAM: CHEST - 2 VIEW COMPARISON:  12/15/2013 FINDINGS: Cardiac shadow is within normal limits. Minimal atelectatic changes are noted in the left lung base. No focal infiltrate or sizable effusion is seen. No bony abnormality is noted. IMPRESSION: Minimal left basilar atelectasis. Electronically Signed   By: Alcide Clever M.D.   On: 12/20/2014 16:37   US Abdomen Complete  12/20/2014  CLINICAL DATA:  Abdominal pain EXAM: ULTRASOUND ABDOMEN COMPLETE COMPARISON:  01/19/2014  FINDINGS: Gallbladder: Multiple gallstones are again identified. No significant gallbladder wall thickening is noted. There is a positive sonographic Murphy sign however. Common bile duct: Diameter: 5 mm Liver: Mild heterogeneity as well as nodularity is noted likely representing underlying cirrhotic change IVC: No abnormality visualized. Pancreas: Visualized portion unremarkable. Spleen: Size and appearance within normal limits. Right Kidney: Length: 8.8.  Mild cortical thinning is noted. Left Kidney: Length: 8.5.  Mild cortical thinning is noted. Abdominal aorta: No aneurysm visualized. Other findings: None. IMPRESSION: Changes of hepatic cirrhosis. Multiple gallstones with positive sonographic Murphy's sign. No wall thickening is noted. Mild cortical thinning of the kidneys bilaterally. Electronically Signed   By: Alcide Clever M.D.   On: 12/20/2014 19:17   I have personally reviewed and evaluated these images and lab results as part of my medical decision-making.   EKG Interpretation   Date/Time:  Sunday December 20 2014 15:42:02 EDT Ventricular Rate:  86 PR Interval:  169 QRS Duration: 86 QT Interval:  395 QTC Calculation: 472 R Axis:   55 Text Interpretation:  Sinus rhythm Low voltage, precordial leads Artifact  No significant change since last tracing Confirmed by ZACKOWSKI  MD, SCOTT  [phone]) on 12/20/2014 3:51:38 PM      MDM   Final diagnoses:  Abdominal pain  Chest pain, unspecified chest pain type  Hypotension, unspecified hypotension type   Patient came from urgent care due to substernal chest pain that radiates down to her epigastric and RUQ area. Troponin negative x1 with unremarkable EKG.   Systolic bood pressures have been in the upper 80s-90s. Not tachycardic but she is on a beta blocker. Afebrile and not tachypneic. Mild white count elevation at 11. She does appear dry on exam. Did not feel this was due to sepsis but more likely dehydration with elevated BUN and Cr. She  responded well to fluids with SBP improving to the 100s. Reports low 100s is her normal at home.   She only meets 1/4 SIRS criteria but lactic acidiosis did not resolve with IVFs and elevated from 2.1 to 2.4. Possible that she has chronic  lactic acidosis due to her liver cirrhosis but cannot rule out infectious etiology at this time. Will start her on broad spectrum antibiotics. Abdominal US only notable for chronic gallstones. No ascites appreciated on exam or on ultrasound but cannot rule out SBP at this time.   Patient will be admitted by hospitalist team.    Valentino Nose, MD 12/20/14 2225

## 2014-12-20 NOTE — ED Notes (Signed)
Pain onset 2 months ago, hurts all the time, every day.  Today reports pain is worse, and reports shaking due to feeling cold.  Pain in chest hurts worse with deep inspiration, pain cuts deep breaths off short.  No productive cough.  Runny nose, watery.  Patient belched in triage and moaned in pain.  Breathing is worse than usual x 2 days.

## 2014-12-20 NOTE — ED Notes (Addendum)
Pt from UC via [facility] with c/o substernal central constant chest pain that radiates to both sides and to her RUQ.  Pt reports that it feels like she's being stabbed when she takes a deep breath.  Pain is worse with movement and is reproducible.  12 lead EKG unremarkable.  Pt in NAD, A&O.

## 2014-12-20 NOTE — ED Notes (Signed)
Bilateral lower extremity edema onset one week ago

## 2014-12-20 NOTE — Discharge Instructions (Signed)

## 2014-12-20 NOTE — ED Notes (Signed)
Attempted report.  Requested that I give the nurse a few minutes and call back.

## 2014-12-20 NOTE — ED Notes (Signed)
[facility] called.  Abigail Crawford stated a truck would not be available for at least 30 minutes

## 2014-12-21 ENCOUNTER — Inpatient Hospital Stay (HOSPITAL_COMMUNITY): Payer: Medicare Other

## 2014-12-21 ENCOUNTER — Encounter (HOSPITAL_COMMUNITY): Payer: Self-pay | Admitting: General Surgery

## 2014-12-21 DIAGNOSIS — K7031 Alcoholic cirrhosis of liver with ascites: Secondary | ICD-10-CM

## 2014-12-21 DIAGNOSIS — R109 Unspecified abdominal pain: Secondary | ICD-10-CM

## 2014-12-21 DIAGNOSIS — R0600 Dyspnea, unspecified: Secondary | ICD-10-CM

## 2014-12-21 DIAGNOSIS — N179 Acute kidney failure, unspecified: Secondary | ICD-10-CM

## 2014-12-21 DIAGNOSIS — A419 Sepsis, unspecified organism: Principal | ICD-10-CM

## 2014-12-21 DIAGNOSIS — R6520 Severe sepsis without septic shock: Secondary | ICD-10-CM

## 2014-12-21 DIAGNOSIS — R6521 Severe sepsis with septic shock: Secondary | ICD-10-CM

## 2014-12-21 DIAGNOSIS — Z452 Encounter for adjustment and management of vascular access device: Secondary | ICD-10-CM | POA: Diagnosis present

## 2014-12-21 LAB — LACTIC ACID, PLASMA
LACTIC ACID, VENOUS: 3.2 mmol/L — AB (ref 0.5–2.0)
LACTIC ACID, VENOUS: 3.4 mmol/L — AB (ref 0.5–2.0)

## 2014-12-21 LAB — COMPREHENSIVE METABOLIC PANEL
ALT: 10 U/L — ABNORMAL LOW (ref 14–54)
AST: 29 U/L (ref 15–41)
Albumin: 2.1 g/dL — ABNORMAL LOW (ref 3.5–5.0)
Alkaline Phosphatase: 78 U/L (ref 38–126)
Anion gap: 10 (ref 5–15)
BILIRUBIN TOTAL: 1.7 mg/dL — AB (ref 0.3–1.2)
BUN: 23 mg/dL — ABNORMAL HIGH (ref 6–20)
CHLORIDE: 99 mmol/L — AB (ref 101–111)
CO2: 23 mmol/L (ref 22–32)
CREATININE: 2.45 mg/dL — AB (ref 0.44–1.00)
Calcium: 8.3 mg/dL — ABNORMAL LOW (ref 8.9–10.3)
GFR, EST AFRICAN AMERICAN: 23 mL/min — AB (ref >60)
GFR, EST NON AFRICAN AMERICAN: 20 mL/min — AB (ref >60)
Glucose, Bld: 74 mg/dL (ref 65–99)
POTASSIUM: 4.8 mmol/L (ref 3.5–5.1)
Sodium: 132 mmol/L — ABNORMAL LOW (ref 135–145)
TOTAL PROTEIN: 5.5 g/dL — AB (ref 6.5–8.1)

## 2014-12-21 LAB — URINE MICROSCOPIC-ADD ON

## 2014-12-21 LAB — CBC
HCT: 24 % — ABNORMAL LOW (ref 36.0–46.0)
Hemoglobin: 7.6 g/dL — ABNORMAL LOW (ref 12.0–15.0)
MCH: 24.5 pg — AB (ref 26.0–34.0)
MCHC: 31.7 g/dL (ref 30.0–36.0)
MCV: 77.4 fL — AB (ref 78.0–100.0)
PLATELETS: 227 10*3/uL (ref 150–400)
RBC: 3.1 MIL/uL — ABNORMAL LOW (ref 3.87–5.11)
RDW: 17.3 % — AB (ref 11.5–15.5)
WBC: 18.9 10*3/uL — ABNORMAL HIGH (ref 4.0–10.5)

## 2014-12-21 LAB — URINALYSIS, ROUTINE W REFLEX MICROSCOPIC
BILIRUBIN URINE: NEGATIVE
Glucose, UA: NEGATIVE mg/dL
HGB URINE DIPSTICK: NEGATIVE
KETONES UR: NEGATIVE mg/dL
NITRITE: POSITIVE — AB
Protein, ur: NEGATIVE mg/dL
SPECIFIC GRAVITY, URINE: 1.017 (ref 1.005–1.030)
UROBILINOGEN UA: 0.2 mg/dL (ref 0.0–1.0)
pH: 5.5 (ref 5.0–8.0)

## 2014-12-21 LAB — GLUCOSE, CAPILLARY: GLUCOSE-CAPILLARY: 84 mg/dL (ref 65–99)

## 2014-12-21 LAB — PROTIME-INR
INR: 1.81 — AB (ref 0.00–1.49)
Prothrombin Time: 21 seconds — ABNORMAL HIGH (ref 11.6–15.2)

## 2014-12-21 LAB — SODIUM, URINE, RANDOM

## 2014-12-21 LAB — MRSA PCR SCREENING: MRSA BY PCR: POSITIVE — AB

## 2014-12-21 MED ORDER — SODIUM CHLORIDE 0.9 % IV BOLUS (SEPSIS)
250.0000 mL | Freq: Once | INTRAVENOUS | Status: AC
  Administered 2014-12-21: 250 mL via INTRAVENOUS

## 2014-12-21 MED ORDER — NOREPINEPHRINE BITARTRATE 1 MG/ML IV SOLN
0.0000 ug/min | INTRAVENOUS | Status: DC
  Filled 2014-12-21: qty 4

## 2014-12-21 MED ORDER — IPRATROPIUM-ALBUTEROL 0.5-2.5 (3) MG/3ML IN SOLN
3.0000 mL | RESPIRATORY_TRACT | Status: DC | PRN
  Administered 2014-12-21 – 2014-12-23 (×2): 3 mL via RESPIRATORY_TRACT
  Filled 2014-12-21: qty 3

## 2014-12-21 MED ORDER — MIDODRINE HCL 5 MG PO TABS: 5.0000 mg | ORAL_TABLET | Freq: Three times a day (TID) | ORAL | Status: DC

## 2014-12-21 MED ORDER — IPRATROPIUM-ALBUTEROL 0.5-2.5 (3) MG/3ML IN SOLN
3.0000 mL | Freq: Four times a day (QID) | RESPIRATORY_TRACT | Status: DC
  Administered 2014-12-21: 3 mL via RESPIRATORY_TRACT
  Filled 2014-12-21: qty 3

## 2014-12-21 MED ORDER — ALBUMIN HUMAN 25 % IV SOLN
50.0000 g | Freq: Once | INTRAVENOUS | Status: AC
  Administered 2014-12-21: 50 g via INTRAVENOUS
  Filled 2014-12-21: qty 200

## 2014-12-21 MED ORDER — MUPIROCIN 2 % EX OINT
1.0000 "application " | TOPICAL_OINTMENT | Freq: Two times a day (BID) | CUTANEOUS | Status: AC
  Administered 2014-12-21 – 2014-12-26 (×10): 1 via NASAL
  Filled 2014-12-21 (×2): qty 22

## 2014-12-21 MED ORDER — SODIUM CHLORIDE 0.9 % IV BOLUS (SEPSIS)
1000.0000 mL | Freq: Once | INTRAVENOUS | Status: AC
  Administered 2014-12-21: 1000 mL via INTRAVENOUS

## 2014-12-21 MED ORDER — SODIUM CHLORIDE 0.9 % IV BOLUS (SEPSIS)
500.0000 mL | Freq: Once | INTRAVENOUS | Status: AC
  Administered 2014-12-21: 500 mL via INTRAVENOUS

## 2014-12-21 MED ORDER — PNEUMOCOCCAL VAC POLYVALENT 25 MCG/0.5ML IJ INJ
0.5000 mL | INJECTION | INTRAMUSCULAR | Status: AC
  Administered 2014-12-22: 0.5 mL via INTRAMUSCULAR
  Filled 2014-12-21: qty 0.5

## 2014-12-21 MED ORDER — LIDOCAINE HCL (PF) 1 % IJ SOLN
INTRAMUSCULAR | Status: AC
  Administered 2014-12-21: 1 mL
  Filled 2014-12-21: qty 10

## 2014-12-21 MED ORDER — PANTOPRAZOLE SODIUM 40 MG IV SOLR
40.0000 mg | Freq: Two times a day (BID) | INTRAVENOUS | Status: DC
  Administered 2014-12-21 – 2015-01-01 (×22): 40 mg via INTRAVENOUS
  Filled 2014-12-21 (×29): qty 40

## 2014-12-21 MED ORDER — FENTANYL CITRATE (PF) 100 MCG/2ML IJ SOLN
25.0000 ug | INTRAMUSCULAR | Status: DC | PRN
  Administered 2014-12-21 – 2014-12-29 (×59): 25 ug via INTRAVENOUS
  Filled 2014-12-21 (×59): qty 2

## 2014-12-21 MED ORDER — IPRATROPIUM-ALBUTEROL 0.5-2.5 (3) MG/3ML IN SOLN
3.0000 mL | RESPIRATORY_TRACT | Status: DC
  Administered 2014-12-22 – 2014-12-23 (×9): 3 mL via RESPIRATORY_TRACT
  Filled 2014-12-21 (×11): qty 3

## 2014-12-21 MED ORDER — MIDODRINE HCL 5 MG PO TABS
5.0000 mg | ORAL_TABLET | Freq: Three times a day (TID) | ORAL | Status: DC
  Administered 2014-12-21 – 2014-12-28 (×19): 5 mg via ORAL
  Filled 2014-12-21 (×24): qty 1

## 2014-12-21 MED ORDER — INFLUENZA VAC SPLIT QUAD 0.5 ML IM SUSY
0.5000 mL | PREFILLED_SYRINGE | INTRAMUSCULAR | Status: AC
  Administered 2014-12-22: 0.5 mL via INTRAMUSCULAR
  Filled 2014-12-21: qty 0.5

## 2014-12-21 MED ORDER — CHLORHEXIDINE GLUCONATE CLOTH 2 % EX PADS
6.0000 | MEDICATED_PAD | Freq: Every day | CUTANEOUS | Status: AC
  Administered 2014-12-22 – 2014-12-26 (×5): 6 via TOPICAL

## 2014-12-21 NOTE — Progress Notes (Signed)
Noted order for transfer to ICU for levophed, contacted Dr Vassie LollAlva (ordering MD) and asked him to speak with pt's daughter that is here at bedside and inform her of what the plan is for pt, pt wants MD to speak with daughter.  Phone given to daughter to speak with him.  Contacted Debbie, rapid response RN of need for ICU bed.

## 2014-12-21 NOTE — Progress Notes (Signed)
PATIENT DETAILS Name: Abigail Crawford Age: 64 y.o. Sex: female Date of Birth: 1950/07/10 Admit Date: 12/20/2014 Admitting Physician Hillary Bow, DO UEA:VWUJWJ,XBJYNWG, PA-C  Brief Narrative: 64 y.o. female with h/o Alcoholic liver cirrhosis presenting with 2 months history of lower chest/abdominal pain-worsening significantly in the past few days. Found to be hypotensive and with ARF on initial presentation  Subjective: Abdomen pain essentially unchanged. Ongoing for 2 months-but worsening recently.  Assessment/Plan: Principal Problem: Adominal pain:Ongoing for 2 months-but worse for the past 2 days. CT Abdomen shows mild ascending colitis-pain is out of proportion to CT Findings. On exam-Abdomen is diffusely tender, with guarding. IR attempted Paracentesis-but Ultrasound abdomen showed no drainable ascites.Keep NPO-continue Zosyn-have consulted GI. Check Lactate-may need Surgery evaluation depending on GI eval  Active Problems: Severe Sepsis with hypotension: Etiology likely GI-but not clear-as no significant drainable ascites to rule out SBP. Leukocytosis increasing.  UA/CXR neg for UTI/PNA.Continue empiric Zosyn.Will bolus IVF-and see if improves. Await repeat Lactic acid. Transfer to SDU. If persistently hypotensive-may need PCCM eval. Follow blood cultures  ARF: likely pre-renal azotemia or ATN in a setting of hypotension/sepsis/diuretics use-unable to rule out developing Hepato-renal synd. Continue IVF-check Urine Na. Close intake/output monitoring.  Hyponatremia:better with IVF-follow  Anemia: hx of GAVE and small esophageal varices (EGD on 08/25/14-at [facility]-report in shadow chart)-no overt evidence of GI loss-suspect more from acute illness/sepsis-type and screen. Transfuse prn  Alcoholic Liver Cirrhoses: last drink > 5 years back. Follows with [facility]. MELD score of 24. Hold all diuretics/propranolol  Disposition: Remain  inpatient  Antimicrobial agents  See below  Anti-infectives    Start     Dose/Rate Route Frequency Ordered Stop   12/20/14 2300  piperacillin-tazobactam (ZOSYN) IVPB 3.375 g     3.375 g 12.5 mL/hr over 240 Minutes Intravenous Every 8 hours 12/20/14 2139     12/20/14 2200  vancomycin (VANCOCIN) 500 mg in sodium chloride 0.9 % 100 mL IVPB  Status:  Discontinued     500 mg 100 mL/hr over 60 Minutes Intravenous Every 24 hours 12/20/14 2139 12/20/14 2216      DVT Prophylaxis: Prophylactic  Heparin  Code Status: Full code  Family Communication None at bedside  Procedures: None  CONSULTS:  GI  Time spent 40 minutes-Greater than 50% of this time was spent in counseling, explanation of diagnosis, planning of further management, and coordination of care.  MEDICATIONS: Scheduled Meds: . folic acid  1 mg Oral Daily  . heparin  5,000 Units Subcutaneous 3 times per day  . [START ON 12/22/2014] Influenza vac split quadrivalent PF  0.5 mL Intramuscular Tomorrow-1000  . ipratropium-albuterol  3 mL Nebulization Q4H  . lidocaine (PF)      . midodrine  5 mg Oral TID WC  . pantoprazole (PROTONIX) IV  40 mg Intravenous Q12H  . piperacillin-tazobactam (ZOSYN)  IV  3.375 g Intravenous Q8H  . [START ON 12/22/2014] pneumococcal 23 valent vaccine  0.5 mL Intramuscular Tomorrow-1000  . sodium chloride  1,000 mL Intravenous Once   Continuous Infusions: . sodium chloride 125 mL/hr at 12/21/14 1044   PRN Meds:.ALPRAZolam, morphine injection    PHYSICAL EXAM: Vital signs in last 24 hours: Filed Vitals:   12/21/14 0513 12/21/14 0911 12/21/14 0928 12/21/14 1100  BP: 95/50 74/43  75/53  Pulse: 82 67  80  Temp: 98.4 F (36.9 C) 98.4 F (36.9 C)    TempSrc: Oral Oral  Resp: 20 20    Height:      Weight:      SpO2: 96% 93% 96% 99%    Weight change:  Filed Weights   12/20/14 2137 12/20/14 2324  Weight: 53.071 kg (117 lb) 53.8 kg (118 lb 9.7 oz)   Body mass index is 21.02  kg/(m^2).   Gen Exam: Awake and alert with clear speech.   Neck: Supple, No JVD.   Chest: B/L Clear.   CVS: S1 S2 Regular, no murmurs.  Abdomen: soft, BS +, diffusely tender with guarding. Mildly distented.  Extremities: + edema, lower extremities warm to touch. Neurologic: Non Focal.   Skin: No Rash.   Wounds: N/A.   Intake/Output from previous day:  Intake/Output Summary (Last 24 hours) at 12/21/14 1115 Last data filed at 12/21/14 0100  Gross per 24 hour  Intake      0 ml  Output    100 ml  Net   -100 ml     LAB RESULTS: CBC  Recent Labs Lab 12/20/14 1600 12/21/14 0359  WBC 11.0* 18.9*  HGB 8.3* 7.6*  HCT 26.6* 24.0*  PLT 255 227  MCV 77.6* 77.4*  MCH 24.2* 24.5*  MCHC 31.2 31.7  RDW 17.2* 17.3*    Chemistries   Recent Labs Lab 12/20/14 1600 12/21/14 0359  NA 129* 132*  K 4.6 4.8  CL 95* 99*  CO2 24 23  GLUCOSE 82 74  BUN 21* 23*  CREATININE 2.30* 2.45*  CALCIUM 8.3* 8.3*    CBG: No results for input(s): GLUCAP in the last 168 hours.  GFR Estimated Creatinine Clearance: 18.9 mL/min (by C-G formula based on Cr of 2.45).  Coagulation profile  Recent Labs Lab 12/21/14 1018  INR 1.81*    Cardiac Enzymes No results for input(s): CKMB, TROPONINI, MYOGLOBIN in the last 168 hours.  Invalid input(s): CK  Invalid input(s): POCBNP No results for input(s): DDIMER in the last 72 hours. No results for input(s): HGBA1C in the last 72 hours. No results for input(s): CHOL, HDL, LDLCALC, TRIG, CHOLHDL, LDLDIRECT in the last 72 hours. No results for input(s): TSH, T4TOTAL, T3FREE, THYROIDAB in the last 72 hours.  Invalid input(s): FREET3 No results for input(s): VITAMINB12, FOLATE, FERRITIN, TIBC, IRON, RETICCTPCT in the last 72 hours.  Recent Labs  12/20/14 1600  LIPASE 36    Urine Studies No results for input(s): UHGB, CRYS in the last 72 hours.  Invalid input(s): UACOL, UAPR, USPG, UPH, UTP, UGL, UKET, UBIL, UNIT, UROB, ULEU, UEPI, UWBC,  URBC, UBAC, CAST, UCOM, BILUA  MICROBIOLOGY: No results found for this or any previous visit (from the past 240 hour(s)).  RADIOLOGY STUDIES/RESULTS: Ct Abdomen Pelvis Wo Contrast  12/21/2014  CLINICAL DATA:  Chronic substernal chest pain, radiating down to the periumbilical region, worse on the right. Initial encounter. EXAM: CT ABDOMEN AND PELVIS WITHOUT CONTRAST TECHNIQUE: Multidetector CT imaging of the abdomen and pelvis was performed following the standard protocol without IV contrast. COMPARISON:  Abdominal ultrasound 12/20/2014, and CT of the abdomen and pelvis from 12/15/2013 FINDINGS: Mild bibasilar atelectasis is noted. Trace pericardial fluid remains within normal limits. Small to moderate volume ascites is seen within the abdomen and pelvis. There is a diffusely nodular contour to the liver, compatible with hepatic cirrhosis. No definite underlying mass is seen, though evaluation is limited without contrast. The spleen is borderline normal in size. Numerous small stones are seen dependently within the gallbladder. There is mild gallbladder wall thickening, nonspecific in the presence  of ascites. The pancreas and adrenal glands are unremarkable in appearance. Mild nonspecific perinephric stranding is noted bilaterally. There is no evidence of hydronephrosis. No renal or ureteral stones are seen. No free fluid is identified. The small bowel is unremarkable in appearance. The stomach is within normal limits. No acute vascular abnormalities are seen. Scattered calcification is noted along the abdominal aorta and its branches. Prominent collateral vessels are noted underlying the right paracolic gutter. The patient is status post appendectomy. There is mild diffuse wall thickening along the ascending colon. This is concerning for acute infectious or inflammatory colitis. The bladder is mildly distended and grossly unremarkable. The uterus is unremarkable in appearance. The ovaries are grossly  symmetric. No suspicious adnexal masses are seen. No inguinal lymphadenopathy is seen. No acute osseous abnormalities are identified. Facet disease is noted at the lower lumbar spine. IMPRESSION: 1. Mild diffuse wall thickening along the ascending colon, reflecting acute infectious or inflammatory colitis. 2. Small to moderate volume ascites within the abdomen and pelvis. 3. Findings of hepatic cirrhosis. Prominent collateral vessels noted underlying the right paracolic gutter. 4. Cholelithiasis. Mild gallbladder wall thickening is nonspecific in the presence of ascites. 5. Scattered calcification along the abdominal aorta and its branches. 6. Mild bibasilar atelectasis noted. Electronically Signed   By: Roanna Raider M.D.   On: 12/21/2014 01:43   Dg Chest 2 View  12/20/2014  CLINICAL DATA:  Midline chest pain shortness of breath for 2 months EXAM: CHEST - 2 VIEW COMPARISON:  12/15/2013 FINDINGS: Cardiac shadow is within normal limits. Minimal atelectatic changes are noted in the left lung base. No focal infiltrate or sizable effusion is seen. No bony abnormality is noted. IMPRESSION: Minimal left basilar atelectasis. Electronically Signed   By: Alcide Clever M.D.   On: 12/20/2014 16:37   US Abdomen Complete  12/20/2014  CLINICAL DATA:  Abdominal pain EXAM: ULTRASOUND ABDOMEN COMPLETE COMPARISON:  01/19/2014 FINDINGS: Gallbladder: Multiple gallstones are again identified. No significant gallbladder wall thickening is noted. There is a positive sonographic Murphy sign however. Common bile duct: Diameter: 5 mm Liver: Mild heterogeneity as well as nodularity is noted likely representing underlying cirrhotic change IVC: No abnormality visualized. Pancreas: Visualized portion unremarkable. Spleen: Size and appearance within normal limits. Right Kidney: Length: 8.8.  Mild cortical thinning is noted. Left Kidney: Length: 8.5.  Mild cortical thinning is noted. Abdominal aorta: No aneurysm visualized. Other findings:  None. IMPRESSION: Changes of hepatic cirrhosis. Multiple gallstones with positive sonographic Murphy's sign. No wall thickening is noted. Mild cortical thinning of the kidneys bilaterally. Electronically Signed   By: Alcide Clever M.D.   On: 12/20/2014 19:17   US Abdomen Limited  12/21/2014  CLINICAL DATA:  Spontaneous bacterial peritonitis. EXAM: LIMITED ABDOMEN ULTRASOUND FOR ASCITES TECHNIQUE: Limited ultrasound survey for ascites was performed in all four abdominal quadrants. COMPARISON:  Abdomen pelvis CT obtained earlier today. FINDINGS: Previously demonstrated small to moderate amount of free peritoneal fluid. No suitable location of fluid for paracentesis was found. IMPRESSION: Small to moderate amount of ascites without easily accessible fluid for paracentesis. Electronically Signed   By: Beckie Salts M.D.   On: 12/21/2014 10:34    Jeoffrey Massed, MD  [facility] Pager:[phone]  If 7PM-7AM, please contact night-coverage [email or web address] Password [email or web address] 12/21/2014, 11:15 AM   LOS: 1 day

## 2014-12-21 NOTE — Procedures (Signed)
Central Venous Catheter Insertion Procedure Note Brain HiltsBleeka T Larrabee [id number] 08/29/1950  Procedure: Insertion of Central Venous Catheter Indications: Assessment of intravascular volume, Drug and/or fluid administration and Frequent blood sampling  Procedure Details Consent: Risks of procedure as well as the alternatives and risks of each were explained to the (patient/caregiver).  Consent for procedure obtained. Time Out: Verified patient identification, verified procedure, site/side was marked, verified correct patient position, special equipment/implants available, medications/allergies/relevent history reviewed, required imaging and test results available.  Performed  Maximum sterile technique was used including antiseptics, cap, gloves, gown, hand hygiene, mask and sheet. Skin prep: Chlorhexidine; local anesthetic administered A antimicrobial bonded/coated triple lumen catheter was placed in the left internal jugular vein using the Seldinger technique.  Evaluation Blood flow good Complications: No apparent complications Patient did tolerate procedure well. Chest X-ray ordered to verify placement.  CXR: pending.  Procedure performed under direct ultrasound guidance for real time vessel cannulation.      Rutherford Guysahul Mckinzie Saksa, [place]PA - [facility] Pager: [phone]  or [phone] 12/21/2014, 6:18 PM

## 2014-12-21 NOTE — Progress Notes (Signed)
Patient presented for US guided paracentesis, upon reviewing images trace to no ascites is seen. Procedure was cancelled.   Pattricia BossKoreen Crystie Yanko PA-C Interventional Radiology  12/21/14 12:00 PM

## 2014-12-21 NOTE — Consult Note (Signed)
Reason for Consult: abdominal pain Referring Physician: Dr. Oren Binet     HPI: Abigail Crawford is a 64 year old female with a history of COPD, ongoing tobacco use, CHF, alcoholic cirrhosis presenting with worsening abdominal pain.  The patient reports abdominal pain over the last 2 months which has been constant.  Worsened by food at times, but other times able to eat chips and soda without any issues.  The pain is located in the epigastric region.  It has been mild.  On Sunday morning, she woke up with worsening chest and abdominal pain.  It is a severe constant pain. Associated with chills and nausea.  Aggravated by movement. No alleviating factors.  No modifying factors.  Characterized as a stabbing pain.  Denies vomiting or fevers.  Denies diarrhea, melena or hematochezia.   Initial work up showed a WBC 11k, sCr 2.3 with hyponatremia, +UA, BNP 1051.  Thought to have SBP and had a paracentesis today, but unable to find any fluid to drain.  WBC this morning increased to 18.9k, worsening renal function.   T bili 1.4 to 1.7 today with normal AST/ALT.  Lactic acid increased to 3.2  Abdominal US last evening showed gallstones without wall thickening, but a positive sonographic murphy's sign.  Abdominal CT(non contrast) showed ascending colon thickening, hepatic cirrhosis, gallbladder wall thickening.  The patient reports having a colonoscopy within the last 2 years which she states was normal. The patient has been hypotensive and now responding to IVF bolus.  We have been asked to evaluate for ongoing abdominal pain     Past Medical History  Diagnosis Date  . CHF (congestive heart failure) ([place])   . Cirrhosis ([place])   . COPD (chronic obstructive pulmonary disease) ([place])   . Chronic kidney disease     "from cirrhosis of the liver"  . Anxiety   . Arthritis   . GERD (gastroesophageal reflux disease)   . Asthma   . Migraines 02/28/11    "I have bad migraines"  . Ascitic fluid     Past Surgical  History  Procedure Laterality Date  . Cataract extraction w/ intraocular lens  implant, bilateral  2011  . Appendectomy  1965    History reviewed. No pertinent family history.  Social History:  reports that she has been smoking Cigarettes.  She has a 45 pack-year smoking history. She has never used smokeless tobacco. She reports that she drinks about 21.0 oz of alcohol per week. She reports that she does not use illicit drugs.  Allergies:  Allergies  Allergen Reactions  . Tylenol [Acetaminophen] Other (See Comments)    Can not take with tramadol-not allergic  . Aspirin Rash  . Cortisone Rash and Other (See Comments)    Red spots.  . Tea Rash    Medications:  No current facility-administered medications on file prior to encounter.   Current Outpatient Prescriptions on File Prior to Encounter  Medication Sig Dispense Refill  . albuterol (PROVENTIL HFA;VENTOLIN HFA) 108 (90 BASE) MCG/ACT inhaler Inhale 2 puffs into the lungs every 4 (four) hours. Scheduled    . ALPRAZolam (XANAX) 0.5 MG tablet Take 0.5 mg by mouth 4 (four) times daily as needed for anxiety or sleep.     Marland Kitchen esomeprazole (NEXIUM) 40 MG capsule Take 40 mg by mouth daily before breakfast.      . folic acid (FOLVITE) 1 MG tablet Take 1 mg by mouth daily.    . furosemide (LASIX) 20 MG tablet Take 20 mg by  mouth daily.      . pantoprazole (PROTONIX) 40 MG tablet Take 40 mg by mouth daily.    . polyethylene glycol (MIRALAX / GLYCOLAX) packet Take 17 g by mouth at bedtime.     . propranolol (INDERAL) 10 MG tablet Take 10 mg by mouth daily.      Marland Kitchen spironolactone (ALDACTONE) 25 MG tablet Take 25 mg by mouth daily.      Marland Kitchen thiamine (VITAMIN B-1) 100 MG tablet Take 100 mg by mouth daily.    . traMADol (ULTRAM) 50 MG tablet Take 50 mg by mouth every 6 (six) hours. Maximum dose= 8 tablets per day    . traZODone (DESYREL) 50 MG tablet Take 50-100 mg by mouth at bedtime.       Results for orders placed or performed during the  hospital encounter of 12/20/14 (from the past 48 hour(s))  Basic metabolic panel     Status: Abnormal   Collection Time: 12/20/14  4:00 PM  Result Value Ref Range   Sodium 129 (L) 135 - 145 mmol/L   Potassium 4.6 3.5 - 5.1 mmol/L   Chloride 95 (L) 101 - 111 mmol/L   CO2 24 22 - 32 mmol/L   Glucose, Bld 82 65 - 99 mg/dL   BUN 21 (H) 6 - 20 mg/dL   Creatinine, Ser 2.30 (H) 0.44 - 1.00 mg/dL   Calcium 8.3 (L) 8.9 - 10.3 mg/dL   GFR calc non Af Amer 21 (L) >60 mL/min   GFR calc Af Amer 24 (L) >60 mL/min    Comment: (NOTE) The eGFR has been calculated using the CKD EPI equation. This calculation has not been validated in all clinical situations. eGFR's persistently <60 mL/min signify possible Chronic Kidney Disease.    Anion gap 10 5 - 15  CBC     Status: Abnormal   Collection Time: 12/20/14  4:00 PM  Result Value Ref Range   WBC 11.0 (H) 4.0 - 10.5 K/uL   RBC 3.43 (L) 3.87 - 5.11 MIL/uL   Hemoglobin 8.3 (L) 12.0 - 15.0 g/dL   HCT 26.6 (L) 36.0 - 46.0 %   MCV 77.6 (L) 78.0 - 100.0 fL   MCH 24.2 (L) 26.0 - 34.0 pg   MCHC 31.2 30.0 - 36.0 g/dL   RDW 17.2 (H) 11.5 - 15.5 %   Platelets 255 150 - 400 K/uL  Brain natriuretic peptide     Status: Abnormal   Collection Time: 12/20/14  4:00 PM  Result Value Ref Range   B Natriuretic Peptide 1051.1 (H) 0.0 - 100.0 pg/mL  Hepatic function panel     Status: Abnormal   Collection Time: 12/20/14  4:00 PM  Result Value Ref Range   Total Protein 6.3 (L) 6.5 - 8.1 g/dL   Albumin 2.5 (L) 3.5 - 5.0 g/dL   AST 28 15 - 41 U/L   ALT 12 (L) 14 - 54 U/L   Alkaline Phosphatase 96 38 - 126 U/L   Total Bilirubin 1.4 (H) 0.3 - 1.2 mg/dL   Bilirubin, Direct 0.5 0.1 - 0.5 mg/dL   Indirect Bilirubin 0.9 0.3 - 0.9 mg/dL  Lipase, blood     Status: None   Collection Time: 12/20/14  4:00 PM  Result Value Ref Range   Lipase 36 11 - 51 U/L    Comment: Please note change in reference range.  I-stat troponin, ED     Status: None   Collection Time: 12/20/14   4:07 PM  Result  Value Ref Range   Troponin i, poc 0.01 0.00 - 0.08 ng/mL   Comment 3            Comment: Due to the release kinetics of cTnI, a negative result within the first hours of the onset of symptoms does not rule out myocardial infarction with certainty. If myocardial infarction is still suspected, repeat the test at appropriate intervals.   I-Stat CG4 Lactic Acid, ED     Status: Abnormal   Collection Time: 12/20/14  7:33 PM  Result Value Ref Range   Lactic Acid, Venous 2.13 (HH) 0.5 - 2.0 mmol/L   Comment NOTIFIED PHYSICIAN   I-Stat CG4 Lactic Acid, ED     Status: Abnormal   Collection Time: 12/20/14  9:18 PM  Result Value Ref Range   Lactic Acid, Venous 2.43 (HH) 0.5 - 2.0 mmol/L   Comment NOTIFIED PHYSICIAN   Urinalysis, Routine w reflex microscopic (not at [facility])     Status: Abnormal   Collection Time: 12/21/14  1:25 AM  Result Value Ref Range   Color, Urine AMBER (A) YELLOW    Comment: BIOCHEMICALS MAY BE AFFECTED BY COLOR   APPearance CLOUDY (A) CLEAR   Specific Gravity, Urine 1.017 1.005 - 1.030   pH 5.5 5.0 - 8.0   Glucose, UA NEGATIVE NEGATIVE mg/dL   Hgb urine dipstick NEGATIVE NEGATIVE   Bilirubin Urine NEGATIVE NEGATIVE   Ketones, ur NEGATIVE NEGATIVE mg/dL   Protein, ur NEGATIVE NEGATIVE mg/dL   Urobilinogen, UA 0.2 0.0 - 1.0 mg/dL   Nitrite POSITIVE (A) NEGATIVE   Leukocytes, UA SMALL (A) NEGATIVE  Urine microscopic-add on     Status: Abnormal   Collection Time: 12/21/14  1:25 AM  Result Value Ref Range   Squamous Epithelial / LPF FEW (A) RARE   WBC, UA 7-10 <3 WBC/hpf   RBC / HPF 0-2 <3 RBC/hpf   Bacteria, UA FEW (A) RARE   Casts HYALINE CASTS (A) NEGATIVE  CBC     Status: Abnormal   Collection Time: 12/21/14  3:59 AM  Result Value Ref Range   WBC 18.9 (H) 4.0 - 10.5 K/uL   RBC 3.10 (L) 3.87 - 5.11 MIL/uL   Hemoglobin 7.6 (L) 12.0 - 15.0 g/dL   HCT 24.0 (L) 36.0 - 46.0 %   MCV 77.4 (L) 78.0 - 100.0 fL   MCH 24.5 (L) 26.0 - 34.0 pg   MCHC  31.7 30.0 - 36.0 g/dL   RDW 17.3 (H) 11.5 - 15.5 %   Platelets 227 150 - 400 K/uL  Comprehensive metabolic panel     Status: Abnormal   Collection Time: 12/21/14  3:59 AM  Result Value Ref Range   Sodium 132 (L) 135 - 145 mmol/L   Potassium 4.8 3.5 - 5.1 mmol/L   Chloride 99 (L) 101 - 111 mmol/L   CO2 23 22 - 32 mmol/L   Glucose, Bld 74 65 - 99 mg/dL   BUN 23 (H) 6 - 20 mg/dL   Creatinine, Ser 2.45 (H) 0.44 - 1.00 mg/dL   Calcium 8.3 (L) 8.9 - 10.3 mg/dL   Total Protein 5.5 (L) 6.5 - 8.1 g/dL   Albumin 2.1 (L) 3.5 - 5.0 g/dL   AST 29 15 - 41 U/L   ALT 10 (L) 14 - 54 U/L   Alkaline Phosphatase 78 38 - 126 U/L   Total Bilirubin 1.7 (H) 0.3 - 1.2 mg/dL   GFR calc non Af Amer 20 (L) >60 mL/min  GFR calc Af Amer 23 (L) >60 mL/min    Comment: (NOTE) The eGFR has been calculated using the CKD EPI equation. This calculation has not been validated in all clinical situations. eGFR's persistently <60 mL/min signify possible Chronic Kidney Disease.    Anion gap 10 5 - 15  Protime-INR     Status: Abnormal   Collection Time: 12/21/14 10:18 AM  Result Value Ref Range   Prothrombin Time 21.0 (H) 11.6 - 15.2 seconds   INR 1.81 (H) 0.00 - 1.49  Lactic acid, plasma     Status: Abnormal   Collection Time: 12/21/14 11:59 AM  Result Value Ref Range   Lactic Acid, Venous 3.2 (HH) 0.5 - 2.0 mmol/L    Comment: CRITICAL RESULT CALLED TO, READ BACK BY AND VERIFIED WITH: Cletus Gash N RN 12/21/14 1305 COSTELLO B     Ct Abdomen Pelvis Wo Contrast  12/21/2014  CLINICAL DATA:  Chronic substernal chest pain, radiating down to the periumbilical region, worse on the right. Initial encounter. EXAM: CT ABDOMEN AND PELVIS WITHOUT CONTRAST TECHNIQUE: Multidetector CT imaging of the abdomen and pelvis was performed following the standard protocol without IV contrast. COMPARISON:  Abdominal ultrasound 12/20/2014, and CT of the abdomen and pelvis from 12/15/2013 FINDINGS: Mild bibasilar atelectasis is noted. Trace  pericardial fluid remains within normal limits. Small to moderate volume ascites is seen within the abdomen and pelvis. There is a diffusely nodular contour to the liver, compatible with hepatic cirrhosis. No definite underlying mass is seen, though evaluation is limited without contrast. The spleen is borderline normal in size. Numerous small stones are seen dependently within the gallbladder. There is mild gallbladder wall thickening, nonspecific in the presence of ascites. The pancreas and adrenal glands are unremarkable in appearance. Mild nonspecific perinephric stranding is noted bilaterally. There is no evidence of hydronephrosis. No renal or ureteral stones are seen. No free fluid is identified. The small bowel is unremarkable in appearance. The stomach is within normal limits. No acute vascular abnormalities are seen. Scattered calcification is noted along the abdominal aorta and its branches. Prominent collateral vessels are noted underlying the right paracolic gutter. The patient is status post appendectomy. There is mild diffuse wall thickening along the ascending colon. This is concerning for acute infectious or inflammatory colitis. The bladder is mildly distended and grossly unremarkable. The uterus is unremarkable in appearance. The ovaries are grossly symmetric. No suspicious adnexal masses are seen. No inguinal lymphadenopathy is seen. No acute osseous abnormalities are identified. Facet disease is noted at the lower lumbar spine. IMPRESSION: 1. Mild diffuse wall thickening along the ascending colon, reflecting acute infectious or inflammatory colitis. 2. Small to moderate volume ascites within the abdomen and pelvis. 3. Findings of hepatic cirrhosis. Prominent collateral vessels noted underlying the right paracolic gutter. 4. Cholelithiasis. Mild gallbladder wall thickening is nonspecific in the presence of ascites. 5. Scattered calcification along the abdominal aorta and its branches. 6. Mild  bibasilar atelectasis noted. Electronically Signed   By: Garald Balding M.D.   On: 12/21/2014 01:43   Dg Chest 2 View  12/20/2014  CLINICAL DATA:  Midline chest pain shortness of breath for 2 months EXAM: CHEST - 2 VIEW COMPARISON:  12/15/2013 FINDINGS: Cardiac shadow is within normal limits. Minimal atelectatic changes are noted in the left lung base. No focal infiltrate or sizable effusion is seen. No bony abnormality is noted. IMPRESSION: Minimal left basilar atelectasis. Electronically Signed   By: Inez Catalina M.D.   On: 12/20/2014 16:37  US Abdomen Complete  12/20/2014  CLINICAL DATA:  Abdominal pain EXAM: ULTRASOUND ABDOMEN COMPLETE COMPARISON:  01/19/2014 FINDINGS: Gallbladder: Multiple gallstones are again identified. No significant gallbladder wall thickening is noted. There is a positive sonographic Murphy sign however. Common bile duct: Diameter: 5 mm Liver: Mild heterogeneity as well as nodularity is noted likely representing underlying cirrhotic change IVC: No abnormality visualized. Pancreas: Visualized portion unremarkable. Spleen: Size and appearance within normal limits. Right Kidney: Length: 8.8.  Mild cortical thinning is noted. Left Kidney: Length: 8.5.  Mild cortical thinning is noted. Abdominal aorta: No aneurysm visualized. Other findings: None. IMPRESSION: Changes of hepatic cirrhosis. Multiple gallstones with positive sonographic Murphy's sign. No wall thickening is noted. Mild cortical thinning of the kidneys bilaterally. Electronically Signed   By: Inez Catalina M.D.   On: 12/20/2014 19:17   US Abdomen Limited  12/21/2014  CLINICAL DATA:  Spontaneous bacterial peritonitis. EXAM: LIMITED ABDOMEN ULTRASOUND FOR ASCITES TECHNIQUE: Limited ultrasound survey for ascites was performed in all four abdominal quadrants. COMPARISON:  Abdomen pelvis CT obtained earlier today. FINDINGS: Previously demonstrated small to moderate amount of free peritoneal fluid. No suitable location of  fluid for paracentesis was found. IMPRESSION: Small to moderate amount of ascites without easily accessible fluid for paracentesis. Electronically Signed   By: Claudie Revering M.D.   On: 12/21/2014 10:34    Review of Systems  Constitutional: Positive for chills, weight loss and malaise/fatigue. Negative for fever and diaphoresis.  HENT: Negative.   Eyes: Negative.   Respiratory: Positive for shortness of breath and wheezing. Negative for cough, hemoptysis and sputum production.   Cardiovascular: Negative for chest pain, palpitations, claudication, leg swelling and PND.  Gastrointestinal: Positive for abdominal pain. Negative for heartburn, nausea, vomiting, diarrhea, constipation, blood in stool and melena.  Genitourinary: Negative for dysuria, urgency, frequency, hematuria and flank pain.  Neurological: Negative for dizziness, tingling, tremors, sensory change, speech change, focal weakness, seizures, loss of consciousness and weakness.   Blood pressure 75/45, pulse 83, temperature 98.6 F (37 C), temperature source Oral, resp. rate 20, height 5' 3" (1.6 m), weight 53.8 kg (118 lb 9.7 oz), SpO2 92 %. Physical Exam  Assessment/Plan: Abdominal pain Cholelithiasis  Sepsis with hypotension, leukocytosis and lactic acidosis  Alcoholic cirrhosis Acute renal failure Anemia  INR 1.8 Ascending colon thickening  The patient is diffusely tender with voluntary guarding, bowel sounds are present.  The abdominal distention is due to ascites.  No intraperitoneal free air seen on CT scan.  I do not think she has an acute abdomen.  The etiology of pain is unclear; possible SBP versus infectious v. IBD v biliary. Paracentesis was not performed due to insignificant ascites.  She does have gallstones on and wall thickening, but the wall thickening can be secondary to her cirrhosis.  I will discuss with Dr. Georgette Dover whether a HIDA scan is warranted.  Agree with antibiotics, serial abdominal exams and aggressive IV  hydration.  Gastroenterology is on board.  She apparently had a colonoscopy <2 years ago which did not show any inflammatory bowel disease.  We will continue to follow her closely.  Thank you for the consult.     Erby Pian ANP-BC Pager [phone] 12/21/2014, 2:12 PM

## 2014-12-21 NOTE — Consult Note (Addendum)
Reason for Consult: Abdominal pain Referring Physician: Hospital team  Abigail Crawford is an 64 y.o. female.  HPI: Patient seen and examined and hospital computer chart reviewed and our office computer chart was reviewed and she has had generalized abdominal pain for a few months and supposedly an endoscopy 3 months ago in [place] which was okay and she's been treating the pain at home with some ibuprofen and Ultram and she denies food making it worse and actually wants to eat here and her bowel movements have been normal without any blood and she has not had any fever nausea or vomiting at home and her surgical history was reviewed and her previous [place] workup including an endoscopy and colonoscopy in 2014 as well as a small bowel series in January and her recent CT and she has no other complaints and one time years ago did have increased fluid and was tapped but not recently  Past Medical History  Diagnosis Date  . CHF (congestive heart failure) ([place])   . Cirrhosis ([place])   . COPD (chronic obstructive pulmonary disease) ([place])   . Chronic kidney disease     "from cirrhosis of the liver"  . Anxiety   . Arthritis   . GERD (gastroesophageal reflux disease)   . Asthma   . Migraines 02/28/11    "I have bad migraines"  . Ascitic fluid     Past Surgical History  Procedure Laterality Date  . Cataract extraction w/ intraocular lens  implant, bilateral  2011  . Appendectomy  1965    History reviewed. No pertinent family history.  Social History:  reports that she has been smoking Cigarettes.  She has a 45 pack-year smoking history. She has never used smokeless tobacco. She reports that she drinks about 21.0 oz of alcohol per week. She reports that she does not use illicit drugs.  Allergies:  Allergies  Allergen Reactions  . Tylenol [Acetaminophen] Other (See Comments)    Can not take with tramadol-not allergic  . Aspirin Rash  . Cortisone Rash and Other (See Comments)    Red spots.   . Tea Rash    Medications: I have reviewed the patient's current medications.  Results for orders placed or performed during the hospital encounter of 12/20/14 (from the past 48 hour(s))  Basic metabolic panel     Status: Abnormal   Collection Time: 12/20/14  4:00 PM  Result Value Ref Range   Sodium 129 (L) 135 - 145 mmol/L   Potassium 4.6 3.5 - 5.1 mmol/L   Chloride 95 (L) 101 - 111 mmol/L   CO2 24 22 - 32 mmol/L   Glucose, Bld 82 65 - 99 mg/dL   BUN 21 (H) 6 - 20 mg/dL   Creatinine, Ser 2.30 (H) 0.44 - 1.00 mg/dL   Calcium 8.3 (L) 8.9 - 10.3 mg/dL   GFR calc non Af Amer 21 (L) >60 mL/min   GFR calc Af Amer 24 (L) >60 mL/min    Comment: (NOTE) The eGFR has been calculated using the CKD EPI equation. This calculation has not been validated in all clinical situations. eGFR's persistently <60 mL/min signify possible Chronic Kidney Disease.    Anion gap 10 5 - 15  CBC     Status: Abnormal   Collection Time: 12/20/14  4:00 PM  Result Value Ref Range   WBC 11.0 (H) 4.0 - 10.5 K/uL   RBC 3.43 (L) 3.87 - 5.11 MIL/uL   Hemoglobin 8.3 (L) 12.0 - 15.0 g/dL  HCT 26.6 (L) 36.0 - 46.0 %   MCV 77.6 (L) 78.0 - 100.0 fL   MCH 24.2 (L) 26.0 - 34.0 pg   MCHC 31.2 30.0 - 36.0 g/dL   RDW 17.2 (H) 11.5 - 15.5 %   Platelets 255 150 - 400 K/uL  Brain natriuretic peptide     Status: Abnormal   Collection Time: 12/20/14  4:00 PM  Result Value Ref Range   B Natriuretic Peptide 1051.1 (H) 0.0 - 100.0 pg/mL  Hepatic function panel     Status: Abnormal   Collection Time: 12/20/14  4:00 PM  Result Value Ref Range   Total Protein 6.3 (L) 6.5 - 8.1 g/dL   Albumin 2.5 (L) 3.5 - 5.0 g/dL   AST 28 15 - 41 U/L   ALT 12 (L) 14 - 54 U/L   Alkaline Phosphatase 96 38 - 126 U/L   Total Bilirubin 1.4 (H) 0.3 - 1.2 mg/dL   Bilirubin, Direct 0.5 0.1 - 0.5 mg/dL   Indirect Bilirubin 0.9 0.3 - 0.9 mg/dL  Lipase, blood     Status: None   Collection Time: 12/20/14  4:00 PM  Result Value Ref Range   Lipase  36 11 - 51 U/L    Comment: Please note change in reference range.  I-stat troponin, ED     Status: None   Collection Time: 12/20/14  4:07 PM  Result Value Ref Range   Troponin i, poc 0.01 0.00 - 0.08 ng/mL   Comment 3            Comment: Due to the release kinetics of cTnI, a negative result within the first hours of the onset of symptoms does not rule out myocardial infarction with certainty. If myocardial infarction is still suspected, repeat the test at appropriate intervals.   I-Stat CG4 Lactic Acid, ED     Status: Abnormal   Collection Time: 12/20/14  7:33 PM  Result Value Ref Range   Lactic Acid, Venous 2.13 (HH) 0.5 - 2.0 mmol/L   Comment NOTIFIED PHYSICIAN   I-Stat CG4 Lactic Acid, ED     Status: Abnormal   Collection Time: 12/20/14  9:18 PM  Result Value Ref Range   Lactic Acid, Venous 2.43 (HH) 0.5 - 2.0 mmol/L   Comment NOTIFIED PHYSICIAN   Urinalysis, Routine w reflex microscopic (not at [facility])     Status: Abnormal   Collection Time: 12/21/14  1:25 AM  Result Value Ref Range   Color, Urine AMBER (A) YELLOW    Comment: BIOCHEMICALS MAY BE AFFECTED BY COLOR   APPearance CLOUDY (A) CLEAR   Specific Gravity, Urine 1.017 1.005 - 1.030   pH 5.5 5.0 - 8.0   Glucose, UA NEGATIVE NEGATIVE mg/dL   Hgb urine dipstick NEGATIVE NEGATIVE   Bilirubin Urine NEGATIVE NEGATIVE   Ketones, ur NEGATIVE NEGATIVE mg/dL   Protein, ur NEGATIVE NEGATIVE mg/dL   Urobilinogen, UA 0.2 0.0 - 1.0 mg/dL   Nitrite POSITIVE (A) NEGATIVE   Leukocytes, UA SMALL (A) NEGATIVE  Urine microscopic-add on     Status: Abnormal   Collection Time: 12/21/14  1:25 AM  Result Value Ref Range   Squamous Epithelial / LPF FEW (A) RARE   WBC, UA 7-10 <3 WBC/hpf   RBC / HPF 0-2 <3 RBC/hpf   Bacteria, UA FEW (A) RARE   Casts HYALINE CASTS (A) NEGATIVE  CBC     Status: Abnormal   Collection Time: 12/21/14  3:59 AM  Result Value Ref Range  WBC 18.9 (H) 4.0 - 10.5 K/uL   RBC 3.10 (L) 3.87 - 5.11 MIL/uL    Hemoglobin 7.6 (L) 12.0 - 15.0 g/dL   HCT 24.0 (L) 36.0 - 46.0 %   MCV 77.4 (L) 78.0 - 100.0 fL   MCH 24.5 (L) 26.0 - 34.0 pg   MCHC 31.7 30.0 - 36.0 g/dL   RDW 17.3 (H) 11.5 - 15.5 %   Platelets 227 150 - 400 K/uL  Comprehensive metabolic panel     Status: Abnormal   Collection Time: 12/21/14  3:59 AM  Result Value Ref Range   Sodium 132 (L) 135 - 145 mmol/L   Potassium 4.8 3.5 - 5.1 mmol/L   Chloride 99 (L) 101 - 111 mmol/L   CO2 23 22 - 32 mmol/L   Glucose, Bld 74 65 - 99 mg/dL   BUN 23 (H) 6 - 20 mg/dL   Creatinine, Ser 2.45 (H) 0.44 - 1.00 mg/dL   Calcium 8.3 (L) 8.9 - 10.3 mg/dL   Total Protein 5.5 (L) 6.5 - 8.1 g/dL   Albumin 2.1 (L) 3.5 - 5.0 g/dL   AST 29 15 - 41 U/L   ALT 10 (L) 14 - 54 U/L   Alkaline Phosphatase 78 38 - 126 U/L   Total Bilirubin 1.7 (H) 0.3 - 1.2 mg/dL   GFR calc non Af Amer 20 (L) >60 mL/min   GFR calc Af Amer 23 (L) >60 mL/min    Comment: (NOTE) The eGFR has been calculated using the CKD EPI equation. This calculation has not been validated in all clinical situations. eGFR's persistently <60 mL/min signify possible Chronic Kidney Disease.    Anion gap 10 5 - 15  Protime-INR     Status: Abnormal   Collection Time: 12/21/14 10:18 AM  Result Value Ref Range   Prothrombin Time 21.0 (H) 11.6 - 15.2 seconds   INR 1.81 (H) 0.00 - 1.49  Lactic acid, plasma     Status: Abnormal   Collection Time: 12/21/14 11:59 AM  Result Value Ref Range   Lactic Acid, Venous 3.2 (HH) 0.5 - 2.0 mmol/L    Comment: CRITICAL RESULT CALLED TO, READ BACK BY AND VERIFIED WITH: Cletus Gash N RN 12/21/14 1305 COSTELLO B     Ct Abdomen Pelvis Wo Contrast  12/21/2014  CLINICAL DATA:  Chronic substernal chest pain, radiating down to the periumbilical region, worse on the right. Initial encounter. EXAM: CT ABDOMEN AND PELVIS WITHOUT CONTRAST TECHNIQUE: Multidetector CT imaging of the abdomen and pelvis was performed following the standard protocol without IV contrast. COMPARISON:   Abdominal ultrasound 12/20/2014, and CT of the abdomen and pelvis from 12/15/2013 FINDINGS: Mild bibasilar atelectasis is noted. Trace pericardial fluid remains within normal limits. Small to moderate volume ascites is seen within the abdomen and pelvis. There is a diffusely nodular contour to the liver, compatible with hepatic cirrhosis. No definite underlying mass is seen, though evaluation is limited without contrast. The spleen is borderline normal in size. Numerous small stones are seen dependently within the gallbladder. There is mild gallbladder wall thickening, nonspecific in the presence of ascites. The pancreas and adrenal glands are unremarkable in appearance. Mild nonspecific perinephric stranding is noted bilaterally. There is no evidence of hydronephrosis. No renal or ureteral stones are seen. No free fluid is identified. The small bowel is unremarkable in appearance. The stomach is within normal limits. No acute vascular abnormalities are seen. Scattered calcification is noted along the abdominal aorta and its branches. Prominent collateral vessels are  noted underlying the right paracolic gutter. The patient is status post appendectomy. There is mild diffuse wall thickening along the ascending colon. This is concerning for acute infectious or inflammatory colitis. The bladder is mildly distended and grossly unremarkable. The uterus is unremarkable in appearance. The ovaries are grossly symmetric. No suspicious adnexal masses are seen. No inguinal lymphadenopathy is seen. No acute osseous abnormalities are identified. Facet disease is noted at the lower lumbar spine. IMPRESSION: 1. Mild diffuse wall thickening along the ascending colon, reflecting acute infectious or inflammatory colitis. 2. Small to moderate volume ascites within the abdomen and pelvis. 3. Findings of hepatic cirrhosis. Prominent collateral vessels noted underlying the right paracolic gutter. 4. Cholelithiasis. Mild gallbladder wall  thickening is nonspecific in the presence of ascites. 5. Scattered calcification along the abdominal aorta and its branches. 6. Mild bibasilar atelectasis noted. Electronically Signed   By: Garald Balding M.D.   On: 12/21/2014 01:43   Dg Chest 2 View  12/20/2014  CLINICAL DATA:  Midline chest pain shortness of breath for 2 months EXAM: CHEST - 2 VIEW COMPARISON:  12/15/2013 FINDINGS: Cardiac shadow is within normal limits. Minimal atelectatic changes are noted in the left lung base. No focal infiltrate or sizable effusion is seen. No bony abnormality is noted. IMPRESSION: Minimal left basilar atelectasis. Electronically Signed   By: Inez Catalina M.D.   On: 12/20/2014 16:37   US Abdomen Complete  12/20/2014  CLINICAL DATA:  Abdominal pain EXAM: ULTRASOUND ABDOMEN COMPLETE COMPARISON:  01/19/2014 FINDINGS: Gallbladder: Multiple gallstones are again identified. No significant gallbladder wall thickening is noted. There is a positive sonographic Murphy sign however. Common bile duct: Diameter: 5 mm Liver: Mild heterogeneity as well as nodularity is noted likely representing underlying cirrhotic change IVC: No abnormality visualized. Pancreas: Visualized portion unremarkable. Spleen: Size and appearance within normal limits. Right Kidney: Length: 8.8.  Mild cortical thinning is noted. Left Kidney: Length: 8.5.  Mild cortical thinning is noted. Abdominal aorta: No aneurysm visualized. Other findings: None. IMPRESSION: Changes of hepatic cirrhosis. Multiple gallstones with positive sonographic Murphy's sign. No wall thickening is noted. Mild cortical thinning of the kidneys bilaterally. Electronically Signed   By: Inez Catalina M.D.   On: 12/20/2014 19:17   US Abdomen Limited  12/21/2014  CLINICAL DATA:  Spontaneous bacterial peritonitis. EXAM: LIMITED ABDOMEN ULTRASOUND FOR ASCITES TECHNIQUE: Limited ultrasound survey for ascites was performed in all four abdominal quadrants. COMPARISON:  Abdomen pelvis CT  obtained earlier today. FINDINGS: Previously demonstrated small to moderate amount of free peritoneal fluid. No suitable location of fluid for paracentesis was found. IMPRESSION: Small to moderate amount of ascites without easily accessible fluid for paracentesis. Electronically Signed   By: Claudie Revering M.D.   On: 12/21/2014 10:34    ROS negative except above Blood pressure 75/45, pulse 83, temperature 98.6 F (37 C), temperature source Oral, resp. rate 20, height $RemoveBe'5\' 3"'[id number]$  (1.6 m), weight 53.8 kg (118 lb 9.7 oz), SpO2 92 %. Physical Exam vital signs stable afebrile no acute distress she looks better than her story sounds lungs are clear heart regular rate and rhythm abdomen is soft rare bowel sounds tender throughout except for the lower quadrants mild guarding throughout not significant obvious rebound to me know. Liver adequate peripheral pulses labs reviewed as was CT and previous workup  Assessment/Plan: Abdominal pain questionable etiology Plan: Would give antibiotics a day or 2 to see if she does not improve and although she has gallstones this does not sound like a gallbladder  problem and will allow clear liquids for now and consider renal consult if needed and will check on tomorrow and consider a set of spot urine lites to help rule out hepatorenal  Surya Folden E 12/21/2014, 2:19 PM

## 2014-12-21 NOTE — Progress Notes (Signed)
*  PRELIMINARY RESULTS* Echocardiogram 2D Echocardiogram has been performed.  Jeryl Columbialliott, Bao Bazen 12/21/2014, 4:44 PM

## 2014-12-21 NOTE — Consult Note (Signed)
PULMONARY / CRITICAL CARE MEDICINE   Name: Abigail Crawford MRN: [id number] DOB: 18-Nov-1950    ADMISSION DATE:  12/20/2014 CONSULTATION DATE:  12/21/14  REFERRING MD :  Dr. Jerral Ralph / [facility]   CHIEF COMPLAINT:  Abdominal pain, concern for sepsis   INITIAL PRESENTATION: 64 y/o F, smoker, with PMH of former ETOH abuse (quit in 2008), alcoholic cirrhosis, GERD, CKD and asthma/COPD who presented to [facility] on 10/23 with complaints of abdominal pain.  She has had ongoing abdominal pain for approximately 2 months and has been work up in [employer].    STUDIES:  CT ABD 10/23 >> mild diffuse wall thickening along the ascending colon concerning for acute infectious/inflammatory colitis, small to moderate volume ascites within the abdomen and pelvis, hepatic cirrhosis with prominent collateral vessels noted, cholelithiasis, mild gallbladder wall thickening (nonspecific in the setting of ascites), scattered abdominal aortic calcification and mild bibasilar atx  ABD [place] 10/23 >> hepatic cirrhosis, multiple gallstones with positive sonographic Murphy sign, no wall thickening noted, mild cortical thinning of the kidneys bilaterally  SIGNIFICANT EVENTS: 10/23  Admit with abdominal pain   HISTORY OF PRESENT ILLNESS: 64 y/o F, smoker (1pk Q3 days), with a past medical history of GERD, PKD, asthma/COPD, former alcohol abuse (quit in 2008) with alcoholic cirrhosis who presented to [facility] on 10/23 with complaints of abdominal pain.  At baseline the patient lives at home with her son. She assist in primary care of a granddaughter that has cerebral palsy. She continues to drive and is able to perform all ADLs. She reports over the last 6 months she has had low blood pressures with systolics ranging on average in the 80s. She reports she has seen 100 systolic once in the last 6 months. Her sons check her blood pressure on a daily basis at home. She reports she had a recent colonoscopy (in the last 2 years) which  was reportedly normal.  Patient presented with plaints of 2 months of abdominal pain.  She also reported worsening shortness of breath and lower extremity swelling over 2 days prior to presentation. She described abdominal pain as diffuse sharp pain, sometimes worse on the left side. She reports she is followed in [facility] by gastroenterology and primary care. Presentation the patient was found to have atrial fibrillation. Admit BP 83/54. Initial lab work:  Na 129,K 4.6,sr cr 2.3, BUN 2.5, total protein 6.3, direct bilirubin 0.5, total bilirubin 1.4, BNP 1051, troponin 0.01, lactic acid 2.13, WBC 11, hgb  8.3, HCT 26.6 and platelets 55.  Urinalysis demonstrated 7-10 WBC, nitrite positive and few bacteria. Abdominal ultrasound was assessed which demonstrated findings consistent with hepatic cirrhosis, multiple gallstones with positive sonographic Murphy sign, no wall thickening noted and mild cortical thinning of the kidneys bilaterally.  The patient was admitted per [facility] for further evaluation.  Paracentesis was arranged for 10/24 to evaluate for SBP. However, on ultrasound evaluation there was not enough fluid to drain. On 10/24, WBC increased 18.9, sr cr to 2.45.  The patient continued to have abdominal pain and fluctuating blood pressure that dropped into the 60s systolic. The patient was treated with 1750 normal saline bolus with improvement in blood pressure to 100 systolic. However, her fluids ceased blood pressure continued to fluctuate from 80s/90s systolic. Repeat lactic acid rose to 3.2. PCCM consulted for evaluation of concern for sepsis.  Currently the patient specifically denies chest pain, pain with inspiration, nausea/vomiting, diarrhea, blood in stool, headaches, syncope/presyncope and headaches.  PAST  MEDICAL HISTORY :   has a past medical history of CHF (congestive heart failure) (HCC); Cirrhosis (HCC); COPD (chronic obstructive pulmonary disease) (HCC); Chronic kidney  disease; Anxiety; Arthritis; GERD (gastroesophageal reflux disease); Asthma; Migraines (02/28/11); and Ascitic fluid.  has past surgical history that includes Cataract extraction w/ intraocular lens  implant, bilateral (2011) and Appendectomy (1965).   Prior to Admission medications   Medication Sig Start Date End Date Taking? Authorizing Provider  albuterol (PROVENTIL HFA;VENTOLIN HFA) 108 (90 BASE) MCG/ACT inhaler Inhale 2 puffs into the lungs every 4 (four) hours. Scheduled   Yes Historical Provider, MD  ALPRAZolam (XANAX) 0.5 MG tablet Take 0.5 mg by mouth 4 (four) times daily as needed for anxiety or sleep.    Yes Historical Provider, MD  esomeprazole (NEXIUM) 40 MG capsule Take 40 mg by mouth daily before breakfast.     Yes Historical Provider, MD  folic acid (FOLVITE) 1 MG tablet Take 1 mg by mouth daily.   Yes Historical Provider, MD  furosemide (LASIX) 20 MG tablet Take 20 mg by mouth daily.     Yes Historical Provider, MD  ibuprofen (ADVIL,MOTRIN) 200 MG tablet Take 200 mg by mouth every 4 (four) hours as needed for moderate pain.    Yes Historical Provider, MD  pantoprazole (PROTONIX) 40 MG tablet Take 40 mg by mouth daily.   Yes Historical Provider, MD  polyethylene glycol (MIRALAX / GLYCOLAX) packet Take 17 g by mouth at bedtime.    Yes Historical Provider, MD  propranolol (INDERAL) 10 MG tablet Take 10 mg by mouth daily.     Yes Historical Provider, MD  spironolactone (ALDACTONE) 25 MG tablet Take 25 mg by mouth daily.     Yes Historical Provider, MD  thiamine (VITAMIN B-1) 100 MG tablet Take 100 mg by mouth daily.   Yes Historical Provider, MD  traMADol (ULTRAM) 50 MG tablet Take 50 mg by mouth every 6 (six) hours. Maximum dose= 8 tablets per day   Yes Historical Provider, MD  traZODone (DESYREL) 50 MG tablet Take 50-100 mg by mouth at bedtime.   Yes Historical Provider, MD   Allergies  Allergen Reactions  . Tylenol [Acetaminophen] Other (See Comments)    Can not take with  tramadol-not allergic  . Aspirin Rash  . Cortisone Rash and Other (See Comments)    Red spots.  . Tea Rash    FAMILY HISTORY:  has no family status information on file.    SOCIAL HISTORY:  reports that she has been smoking Cigarettes.  She has a 45 pack-year smoking history. She has never used smokeless tobacco. She reports that she drinks about 21.0 oz of alcohol per week. She reports that she does not use illicit drugs.  REVIEW OF SYSTEMS:   Gen: Denies fever, chills, weight change, fatigue, night sweats HEENT: Denies blurred vision, double vision, hearing loss, tinnitus, sinus congestion, rhinorrhea, sore throat, neck stiffness, dysphagia PULM: Denies shortness of breath, cough, sputum production, hemoptysis, wheezing CV: Denies chest pain, orthopnea, paroxysmal nocturnal dyspnea, palpitations.  Reports LE edema.   GI: Denies nausea, vomiting, diarrhea, hematochezia, melena, constipation, change in bowel habits.Reports diffuse abdominal pain, pain when leg raised, focused mid-epigastric pain, pain with palpation of abd GU: Denies dysuria, hematuria, polyuria, oliguria, urethral discharge Endocrine: Denies hot or cold intolerance, polyuria, polyphagia or appetite change Derm: Denies rash, dry skin, scaling or peeling skin change Heme: Denies easy bruising, bleeding, bleeding gums Neuro: Denies headache, numbness, weakness, slurred speech, loss of memory or consciousness  SUBJECTIVE:   VITAL SIGNS: Temp:  [98.2 F (36.8 C)-98.7 F (37.1 C)] 98.6 F (37 C) (10/24 1243) Pulse Rate:  [56-86] 83 (10/24 1342) Resp:  [14-25] 20 (10/24 1342) BP: (74-105)/(41-57) 75/45 mmHg (10/24 1342) SpO2:  [92 %-100 %] 92 % (10/24 1342) Weight:  [117 lb (53.071 kg)-118 lb 9.7 oz (53.8 kg)] 118 lb 9.7 oz (53.8 kg) (10/23 2324) HEMODYNAMICS:   VENTILATOR SETTINGS:   INTAKE / OUTPUT:  Intake/Output Summary (Last 24 hours) at 12/21/14 1428 Last data filed at 12/21/14 0100  Gross per 24 hour   Intake      0 ml  Output    100 ml  Net   -100 ml    PHYSICAL EXAMINATION: General:  Chronically ill appearing female in NAD, temporal wasting  Neuro:  AAOx4, speech clear, MAE  HEENT:  MM pink/dry, no jvd Cardiovascular:  s1s2 rrr, no m/r/g Lungs:  resp's even/non-labored, lungs bilaterally with wheezing R>L  Abdomen:  Distended, tender to palpation diffusely, pain with leg raise  Musculoskeletal:  No acute deformities  Skin:  Warm/dry, BLE edema 2+  LABS:  CBC  Recent Labs Lab 12/20/14 1600 12/21/14 0359  WBC 11.0* 18.9*  HGB 8.3* 7.6*  HCT 26.6* 24.0*  PLT 255 227   Coag's  Recent Labs Lab 12/21/14 1018  INR 1.81*   BMET  Recent Labs Lab 12/20/14 1600 12/21/14 0359  NA 129* 132*  K 4.6 4.8  CL 95* 99*  CO2 24 23  BUN 21* 23*  CREATININE 2.30* 2.45*  GLUCOSE 82 74   Electrolytes  Recent Labs Lab 12/20/14 1600 12/21/14 0359  CALCIUM 8.3* 8.3*   Sepsis Markers  Recent Labs Lab 12/20/14 1933 12/20/14 2118 12/21/14 1159  LATICACIDVEN 2.13* 2.43* 3.2*   ABG No results for input(s): PHART, PCO2ART, PO2ART in the last 168 hours. Liver Enzymes  Recent Labs Lab 12/20/14 1600 12/21/14 0359  AST 28 29  ALT 12* 10*  ALKPHOS 96 78  BILITOT 1.4* 1.7*  ALBUMIN 2.5* 2.1*   Cardiac Enzymes No results for input(s): TROPONINI, PROBNP in the last 168 hours. Glucose No results for input(s): GLUCAP in the last 168 hours.  Imaging Ct Abdomen Pelvis Wo Contrast  12/21/2014  CLINICAL DATA:  Chronic substernal chest pain, radiating down to the periumbilical region, worse on the right. Initial encounter. EXAM: CT ABDOMEN AND PELVIS WITHOUT CONTRAST TECHNIQUE: Multidetector CT imaging of the abdomen and pelvis was performed following the standard protocol without IV contrast. COMPARISON:  Abdominal ultrasound 12/20/2014, and CT of the abdomen and pelvis from 12/15/2013 FINDINGS: Mild bibasilar atelectasis is noted. Trace pericardial fluid remains within  normal limits. Small to moderate volume ascites is seen within the abdomen and pelvis. There is a diffusely nodular contour to the liver, compatible with hepatic cirrhosis. No definite underlying mass is seen, though evaluation is limited without contrast. The spleen is borderline normal in size. Numerous small stones are seen dependently within the gallbladder. There is mild gallbladder wall thickening, nonspecific in the presence of ascites. The pancreas and adrenal glands are unremarkable in appearance. Mild nonspecific perinephric stranding is noted bilaterally. There is no evidence of hydronephrosis. No renal or ureteral stones are seen. No free fluid is identified. The small bowel is unremarkable in appearance. The stomach is within normal limits. No acute vascular abnormalities are seen. Scattered calcification is noted along the abdominal aorta and its branches. Prominent collateral vessels are noted underlying the right paracolic gutter. The patient is status post appendectomy.  There is mild diffuse wall thickening along the ascending colon. This is concerning for acute infectious or inflammatory colitis. The bladder is mildly distended and grossly unremarkable. The uterus is unremarkable in appearance. The ovaries are grossly symmetric. No suspicious adnexal masses are seen. No inguinal lymphadenopathy is seen. No acute osseous abnormalities are identified. Facet disease is noted at the lower lumbar spine. IMPRESSION: 1. Mild diffuse wall thickening along the ascending colon, reflecting acute infectious or inflammatory colitis. 2. Small to moderate volume ascites within the abdomen and pelvis. 3. Findings of hepatic cirrhosis. Prominent collateral vessels noted underlying the right paracolic gutter. 4. Cholelithiasis. Mild gallbladder wall thickening is nonspecific in the presence of ascites. 5. Scattered calcification along the abdominal aorta and its branches. 6. Mild bibasilar atelectasis noted.  Electronically Signed   By: Roanna Raider M.D.   On: 12/21/2014 01:43   Dg Chest 2 View  12/20/2014  CLINICAL DATA:  Midline chest pain shortness of breath for 2 months EXAM: CHEST - 2 VIEW COMPARISON:  12/15/2013 FINDINGS: Cardiac shadow is within normal limits. Minimal atelectatic changes are noted in the left lung base. No focal infiltrate or sizable effusion is seen. No bony abnormality is noted. IMPRESSION: Minimal left basilar atelectasis. Electronically Signed   By: Alcide Clever M.D.   On: 12/20/2014 16:37   US Abdomen Complete  12/20/2014  CLINICAL DATA:  Abdominal pain EXAM: ULTRASOUND ABDOMEN COMPLETE COMPARISON:  01/19/2014 FINDINGS: Gallbladder: Multiple gallstones are again identified. No significant gallbladder wall thickening is noted. There is a positive sonographic Murphy sign however. Common bile duct: Diameter: 5 mm Liver: Mild heterogeneity as well as nodularity is noted likely representing underlying cirrhotic change IVC: No abnormality visualized. Pancreas: Visualized portion unremarkable. Spleen: Size and appearance within normal limits. Right Kidney: Length: 8.8.  Mild cortical thinning is noted. Left Kidney: Length: 8.5.  Mild cortical thinning is noted. Abdominal aorta: No aneurysm visualized. Other findings: None. IMPRESSION: Changes of hepatic cirrhosis. Multiple gallstones with positive sonographic Murphy's sign. No wall thickening is noted. Mild cortical thinning of the kidneys bilaterally. Electronically Signed   By: Alcide Clever M.D.   On: 12/20/2014 19:17   US Abdomen Limited  12/21/2014  CLINICAL DATA:  Spontaneous bacterial peritonitis. EXAM: LIMITED ABDOMEN ULTRASOUND FOR ASCITES TECHNIQUE: Limited ultrasound survey for ascites was performed in all four abdominal quadrants. COMPARISON:  Abdomen pelvis CT obtained earlier today. FINDINGS: Previously demonstrated small to moderate amount of free peritoneal fluid. No suitable location of fluid for paracentesis was found.  IMPRESSION: Small to moderate amount of ascites without easily accessible fluid for paracentesis. Electronically Signed   By: Beckie Salts M.D.   On: 12/21/2014 10:34     ASSESSMENT / PLAN:  PULMONARY OETT  A: COPD  ? Asthma  Tobacco Abuse  At Risk Respiratory Failure  P:   Duoneb Q6 + Q3 PRN albuterol  Pulmonary hygiene  Trend CXR   CARDIOVASCULAR CVL A:  Hypotension - reported chronic but need to r/o acute causes, ? If she is that far from her reported baseline BP Elevated BNP  CHF - listed in history, no ECHO available.  P:  Monitor hemodynamics on SDU Repeat lactic acid in 4 hours, if rising will place central line Add midodrine TID  Assess ECHO  NS @ 150, caution with volume  Trend BNP   RENAL A:   Acute Renal Failure - in the setting of volume depletion, concern for acute infection Lactic Acidosis - ? Clearance with AKI  Hyponatremia  P:   Trend BMP / UOP  Replace electrolytes as indicated  Follow up lactic acid as above Assess renal ultra sound  GASTROINTESTINAL A:   Abdominal Pain - ongoing x 2 months, ddx includes ischemic colitis, biliary colic, infectious colitis  Alcoholic Cirrhosis - MELD score 22 on admit.     Protein Calorie Malnutrition  GERD  P:   CCS consulted Liquid diet as tolerated   HEMATOLOGIC A:   Anemia - Hgb from 11/2013 noted to be 11 --> currently 7.6 P:  Assess stool for FOB  Heparin for DVT prophylaxis   INFECTIOUS A:   UTI  Abdominal Pain - r/o infectious process  P:   BCx2 10/23 >>  UA 10/23 >> 7-10 wbc, nitrite positive   Zosyn, start date 10/23, day 2/x  ENDOCRINE A:   At Risk Hyper / Hypoglycemia  P:   Monitor glucose on BMP   NEUROLOGIC A:   Pain  Anxiety  P:   RASS goal: n/a PRN Morphine for pain   FAMILY  - Updates:  No family available at bedside.  Patient updated on plan of care.    - Inter-disciplinary family meet or Palliative Care meeting due by:  10/24 - confirmed code status with  patient.  She wants to further discuss with her husband / family but she would be accepting of mechanical ventilation, feeding tube and ACLS if it would return her to most recent state of health. She would not find it acceptable to live "needing machines".   Canary Brim, NP-C [facility] Pgr: [phone] or if no answer [phone] 12/21/2014, 2:28 PM

## 2014-12-21 NOTE — Progress Notes (Signed)
Report called to Verlon AuLeslie, RN on unit 2MW, pt transferring to bed 2MW10.

## 2014-12-21 NOTE — Progress Notes (Signed)
Pt arrived on unit 2330 hrs, A&O, C/O epigastric pain, Pt oriented to unit room and equipment, admission orders implemented

## 2014-12-21 NOTE — Progress Notes (Signed)
Pt transferred to via bed and monitor with RN, pt's daughter present and made aware of new room number.

## 2014-12-21 NOTE — Care Management Note (Signed)
Case Management Note  Patient Details  Name: Abigail Crawford MRN: [id number] Date of Birth: Aug 08, 1950  Subjective/Objective:                    Action/Plan: Patient was admitted with abdominal pain. Lives at home with spouse. Will follow for discharge needs.  Expected Discharge Date:                  Expected Discharge Plan:  Home/Self Care  In-House Referral:     Discharge planning Services     Post Acute Care Choice:    Choice offered to:     DME Arranged:    DME Agency:     HH Arranged:    HH Agency:     Status of Service:  In process, will continue to follow  Medicare Important Message Given:    Date Medicare IM Given:    Medicare IM give by:    Date Additional Medicare IM Given:    Additional Medicare Important Message give by:     If discussed at Long Length of Stay Meetings, dates discussed:    Additional Comments:  Anda KraftRobarge, Keirstin Musil C, RN 12/21/2014, 9:50 AM

## 2014-12-22 DIAGNOSIS — I959 Hypotension, unspecified: Secondary | ICD-10-CM

## 2014-12-22 DIAGNOSIS — R1010 Upper abdominal pain, unspecified: Secondary | ICD-10-CM

## 2014-12-22 DIAGNOSIS — A419 Sepsis, unspecified organism: Secondary | ICD-10-CM | POA: Diagnosis not present

## 2014-12-22 LAB — HEMOGLOBIN AND HEMATOCRIT, BLOOD
HEMATOCRIT: 25.1 % — AB (ref 36.0–46.0)
HEMOGLOBIN: 8.2 g/dL — AB (ref 12.0–15.0)

## 2014-12-22 LAB — CBC
HCT: 20.7 % — ABNORMAL LOW (ref 36.0–46.0)
HEMOGLOBIN: 6.6 g/dL — AB (ref 12.0–15.0)
MCH: 24.4 pg — ABNORMAL LOW (ref 26.0–34.0)
MCHC: 31.9 g/dL (ref 30.0–36.0)
MCV: 76.4 fL — AB (ref 78.0–100.0)
PLATELETS: 235 10*3/uL (ref 150–400)
RBC: 2.71 MIL/uL — AB (ref 3.87–5.11)
RDW: 17.4 % — ABNORMAL HIGH (ref 11.5–15.5)
WBC: 19.8 10*3/uL — AB (ref 4.0–10.5)

## 2014-12-22 LAB — BASIC METABOLIC PANEL
ANION GAP: 10 (ref 5–15)
BUN: 31 mg/dL — ABNORMAL HIGH (ref 6–20)
CO2: 20 mmol/L — ABNORMAL LOW (ref 22–32)
CREATININE: 2.48 mg/dL — AB (ref 0.44–1.00)
Calcium: 7.8 mg/dL — ABNORMAL LOW (ref 8.9–10.3)
Chloride: 99 mmol/L — ABNORMAL LOW (ref 101–111)
GFR calc Af Amer: 22 mL/min — ABNORMAL LOW (ref >60)
GFR calc non Af Amer: 19 mL/min — ABNORMAL LOW (ref >60)
GLUCOSE: 87 mg/dL (ref 65–99)
POTASSIUM: 4.3 mmol/L (ref 3.5–5.1)
SODIUM: 129 mmol/L — AB (ref 135–145)

## 2014-12-22 LAB — PREPARE RBC (CROSSMATCH)

## 2014-12-22 LAB — BILIRUBIN, FRACTIONATED(TOT/DIR/INDIR)
BILIRUBIN TOTAL: 1.2 mg/dL (ref 0.3–1.2)
Bilirubin, Direct: 0.5 mg/dL (ref 0.1–0.5)
Indirect Bilirubin: 0.7 mg/dL (ref 0.3–0.9)

## 2014-12-22 LAB — LACTIC ACID, PLASMA: Lactic Acid, Venous: 1.9 mmol/L (ref 0.5–2.0)

## 2014-12-22 LAB — BRAIN NATRIURETIC PEPTIDE: B Natriuretic Peptide: 1281.2 pg/mL — ABNORMAL HIGH (ref 0.0–100.0)

## 2014-12-22 MED ORDER — SODIUM CHLORIDE 0.9 % IV SOLN
Freq: Once | INTRAVENOUS | Status: AC
  Administered 2014-12-22: 06:00:00 via INTRAVENOUS

## 2014-12-22 MED ORDER — PIPERACILLIN-TAZOBACTAM IN DEX 2-0.25 GM/50ML IV SOLN
2.2500 g | Freq: Four times a day (QID) | INTRAVENOUS | Status: DC
  Administered 2014-12-22 – 2014-12-24 (×9): 2.25 g via INTRAVENOUS
  Filled 2014-12-22 (×12): qty 50

## 2014-12-22 NOTE — Progress Notes (Addendum)
Brain HiltsBleeka T Murray 9:31 AM  Subjective: Patient's breathing is worse but her abdominal pain is no different and no new complaints Objective: Blood pressure little better lungs are worse with increased wheezing abdomen seems to be less tender without rebound on my exam increased white count bilirubin decreased increased BUN and creatinine with decreased urinary sodium  Assessment: Abdominal pain and renal failure questionably etiology  Plan: I do not think she needs surgical options at this time continue antibiotics and consider nephrology consult and clear liquid diet is okay with me if okay with surgical team [facility] HospitalMAG[facility],Alyssa Mancera E  Pager [phone] After 5PM or if no answer call [phone]

## 2014-12-22 NOTE — Progress Notes (Signed)
ANTIBIOTIC CONSULT NOTE - FOLLOW UP  Pharmacy Consult for Zosyn Indication: rule out sepsis and SBP  Allergies  Allergen Reactions  . Tylenol [Acetaminophen] Other (See Comments)    Can not take with tramadol-not allergic  . Aspirin Rash  . Cortisone Rash and Other (See Comments)    Red spots.  . Tea Rash   Patient Measurements: Height:  (160 cm) Weight: 118 lb 9.7 oz (53.8 kg) IBW/kg (Calculated) : 52.4 Vital Signs: Temp: 98.8 F (37.1 C) (10/25 0759) Temp Source: Oral (10/25 0759) BP: 131/58 mmHg (10/25 0800) Pulse Rate: 69 (10/25 0800) Intake/Output from previous day: 10/24 0701 - 10/25 0700 In: 4085 [P.O.:60; I.V.:3590; Blood:335; IV Piggyback:100] Out: 600 [Urine:600] Intake/Output from this shift: Total I/O In: -  Out: 125 [Urine:125]  Labs:  Recent Labs  12/20/14 1600 12/21/14 0359 12/22/14 0330  WBC 11.0* 18.9* 19.8*  HGB 8.3* 7.6* 6.6*  PLT 255 227 235  CREATININE 2.30* 2.45* 2.48*   Estimated Creatinine Clearance: 18.7 mL/min (by C-G formula based on Cr of 2.48). No results for input(s): VANCOTROUGH, VANCOPEAK, VANCORANDOM, GENTTROUGH, GENTPEAK, GENTRANDOM, TOBRATROUGH, TOBRAPEAK, TOBRARND, AMIKACINPEAK, AMIKACINTROU, AMIKACIN in the last 72 hours.   Microbiology: Recent Results (from the past 720 hour(s))  Culture, blood (routine x 2)     Status: None (Preliminary result)   Collection Time: 12/20/14  8:19 PM  Result Value Ref Range Status   Specimen Description BLOOD RIGHT ARM  Final   Special Requests BOTTLES DRAWN AEROBIC AND ANAEROBIC 10CC  Final   Culture NO GROWTH < 24 HOURS  Final   Report Status PENDING  Incomplete  Culture, blood (routine x 2)     Status: None (Preliminary result)   Collection Time: 12/20/14  8:30 PM  Result Value Ref Range Status   Specimen Description BLOOD RIGHT HAND  Final   Special Requests BOTTLES DRAWN AEROBIC AND ANAEROBIC 3CC  Final   Culture NO GROWTH < 24 HOURS  Final   Report Status PENDING  Incomplete   MRSA PCR Screening     Status: Abnormal   Collection Time: 12/21/14  1:33 PM  Result Value Ref Range Status   MRSA by PCR POSITIVE (A) NEGATIVE Final    Comment:        The GeneXpert MRSA Assay (FDA approved for NASAL specimens only), is one component of a comprehensive MRSA colonization surveillance program. It is not intended to diagnose MRSA infection nor to guide or monitor treatment for MRSA infections. RESULT CALLED TO, READ BACK BY AND VERIFIED WITH: T. PENNINGTON RN 15:50 12/21/14 ([email or web address])     Anti-infectives    Start     Dose/Rate Route Frequency Ordered Stop   12/20/14 2300  piperacillin-tazobactam (ZOSYN) IVPB 3.375 g     3.375 g 12.5 mL/hr over 240 Minutes Intravenous Every 8 hours 12/20/14 2139     12/20/14 2200  vancomycin (VANCOCIN) 500 mg in sodium chloride 0.9 % 100 mL IVPB  Status:  Discontinued     500 mg 100 mL/hr over 60 Minutes Intravenous Every 24 hours 12/20/14 2139 12/20/14 2216      Assessment: 64 year old female with hx cirrhosis admitted with 2 month history of abdominal pain and concern for SBP on Zosyn. She has acute on chronic renal failure and SCr is rising this AM. UOP 1.4 cc/kg/hr last 8 hours.   Goal of Therapy:  Clinical resolution of infection  Plan:  Adjust Zosyn to 2.25g IV every 6 hours for worsening renal function.  Monitor clinical status, culture results, and renal function.   Link SnufferJessica Maximino Cozzolino, PharmD, BCPS Clinical Pharmacist [phone] 12/22/2014,8:33 AM

## 2014-12-22 NOTE — Progress Notes (Signed)
CRITICAL VALUE ALERT  Critical value received:  Hemoglobin 6.6  Date of notification:  12/22/2014  Time of notification:  0442  Critical value read back:yes  Nurse who received alert:  Jacklyn ShellAnne Demarcus Thielke RN  MD notified (1st page):  Dr.McQuaid  Time of first page:  0450  MD notified (2nd page):  Time of second page:  Responding MD:  Dr.McQuaid  Time MD responded:  [phone]

## 2014-12-22 NOTE — Progress Notes (Signed)
PULMONARY / CRITICAL CARE MEDICINE   Name: Abigail Crawford MRN: [id number] DOB: 05/03/1950    ADMISSION DATE:  12/20/2014 CONSULTATION DATE:  12/21/14  REFERRING MD :  Dr. Jerral Ralph / [facility]   CHIEF COMPLAINT:  Abdominal pain, concern for sepsis   INITIAL PRESENTATION: 64 y/o F, smoker, with PMH of former ETOH abuse (quit in 2008), alcoholic cirrhosis, GERD, CKD and asthma/COPD who presented to [facility] on 10/23 with complaints of abdominal pain.  She has had ongoing abdominal pain for approximately 2 months and has been work up in [employer].  Transfer to ICU 10/24 for pressor support.  STUDIES:  CT ABD 10/23 >> mild diffuse wall thickening along the ascending colon concerning for acute infectious/inflammatory colitis, small to moderate volume ascites within the abdomen and pelvis, hepatic cirrhosis with prominent collateral vessels noted, cholelithiasis, mild gallbladder wall thickening (nonspecific in the setting of ascites), scattered abdominal aortic calcification and mild bibasilar atx  ABD [place] 10/23 >> hepatic cirrhosis, multiple gallstones with positive sonographic Murphy sign, no wall thickening noted, mild cortical thinning of the kidneys bilaterally  SIGNIFICANT EVENTS: 10/23  Admit with abdominal pain    SUBJECTIVE:  No events overnight.  VITAL SIGNS: Temp:  [98.1 F (36.7 C)-98.6 F (37 C)] 98.1 F (36.7 C) (10/25 0605) Pulse Rate:  [61-86] 61 (10/25 0700) Resp:  [14-35] 15 (10/25 0700) BP: (74-151)/(33-92) 107/44 mmHg (10/25 0700) SpO2:  [91 %-99 %] 96 % (10/25 0700) HEMODYNAMICS:   VENTILATOR SETTINGS:   INTAKE / OUTPUT:  Intake/Output Summary (Last 24 hours) at 12/22/14 0741 Last data filed at 12/22/14 0550  Gross per 24 hour  Intake   4085 ml  Output    600 ml  Net   3485 ml    PHYSICAL EXAMINATION: General:  Chronically ill appearing female in NAD, temporal wasting Neuro:  AAOx4, speech clear, MAE  HEENT:  MMM, NCAT Cardiovascular:  s1s2 rrr, no m/r/g Lungs:   resp's even/non-labored, lungs bilaterally with wheezing  Abdomen:  Distended, tender to palpation diffusely  Musculoskeletal:  No acute deformities  Skin:  Warm/dry, BLE edema 2+  LABS:  CBC  Recent Labs Lab 12/20/14 1600 12/21/14 0359 12/22/14 0330  WBC 11.0* 18.9* 19.8*  HGB 8.3* 7.6* 6.6*  HCT 26.6* 24.0* 20.7*  PLT 255 227 235   Coag's  Recent Labs Lab 12/21/14 1018  INR 1.81*   BMET  Recent Labs Lab 12/20/14 1600 12/21/14 0359 12/22/14 0330  NA 129* 132* 129*  K 4.6 4.8 4.3  CL 95* 99* 99*  CO2 24 23 20*  BUN 21* 23* 31*  CREATININE 2.30* 2.45* 2.48*  GLUCOSE 82 74 87   Electrolytes  Recent Labs Lab 12/20/14 1600 12/21/14 0359 12/22/14 0330  CALCIUM 8.3* 8.3* 7.8*   Sepsis Markers  Recent Labs Lab 12/20/14 2118 12/21/14 1159 12/21/14 1600  LATICACIDVEN 2.43* 3.2* 3.4*   ABG No results for input(s): PHART, PCO2ART, PO2ART in the last 168 hours. Liver Enzymes  Recent Labs Lab 12/20/14 1600 12/21/14 0359 12/22/14 0330  AST 28 29  --   ALT 12* 10*  --   ALKPHOS 96 78  --   BILITOT 1.4* 1.7* 1.2  ALBUMIN 2.5* 2.1*  --    Cardiac Enzymes No results for input(s): TROPONINI, PROBNP in the last 168 hours. Glucose  Recent Labs Lab 12/21/14 1727  GLUCAP 84    Imaging US Renal  12/21/2014  CLINICAL DATA:  Patient with acute renal failure. EXAM: RENAL / URINARY TRACT ULTRASOUND  COMPLETE COMPARISON:  Abdominal ultrasound 12/21/2014 FINDINGS: Right Kidney: Length: 9.4 cm. No hydronephrosis. Mild increased renal cortical echogenicity. Mild renal cortical thinning. Left Kidney: Length: 9.9 cm. No hydronephrosis. Mild increased renal cortical echogenicity. Mild renal cortical thinning. Bladder: Appears normal for degree of bladder distention. Ascites. IMPRESSION: No hydronephrosis. Mild increased renal cortical echogenicity compatible with chronic medical renal disease. Electronically Signed   By: Annia Beltrew  Davis M.D.   On: 12/21/2014 19:52    [place] Abdomen Limited  12/21/2014  CLINICAL DATA:  Spontaneous bacterial peritonitis. EXAM: LIMITED ABDOMEN ULTRASOUND FOR ASCITES TECHNIQUE: Limited ultrasound survey for ascites was performed in all four abdominal quadrants. COMPARISON:  Abdomen pelvis CT obtained earlier today. FINDINGS: Previously demonstrated small to moderate amount of free peritoneal fluid. No suitable location of fluid for paracentesis was found. IMPRESSION: Small to moderate amount of ascites without easily accessible fluid for paracentesis. Electronically Signed   By: Beckie SaltsSteven  Reid M.D.   On: 12/21/2014 10:34   Dg Chest Port 1 View  12/21/2014  CLINICAL DATA:  64 year old female following central line placement. Evaluate proper placement. EXAM: PORTABLE CHEST 1 VIEW COMPARISON:  Chest x-ray 12/20/2014. FINDINGS: Interval placement of a left internal jugular central venous catheter with tip terminating at the superior cavoatrial junction. No pneumothorax. Lung volumes are low. There are some bibasilar opacities which are favored to reflect subsegmental atelectasis. No definite acute consolidative airspace disease. No pleural effusions. No evidence of pulmonary edema. Heart size is upper limits of normal, accentuated by low lung volumes and portable AP technique. Upper mediastinal contours are within normal limits. Atherosclerosis in the thoracic aorta. IMPRESSION: 1. Interval placement of left IJ central venous catheter, which appears properly located with tip at the superior cavoatrial junction. 2. No pneumothorax or other acute complicating features. 3. Low lung volumes with bibasilar subsegmental atelectasis. Electronically Signed   By: Trudie Reedaniel  Entrikin M.D.   On: 12/21/2014 18:53     ASSESSMENT / PLAN:  PULMONARY OETT  A: COPD  ? Asthma  Tobacco Abuse  At Risk Respiratory Failure  P:   Duoneb Q6 + Q3 PRN albuterol  Pulmonary hygiene  Trend CXR   CARDIOVASCULAR CVL 10/24 >>> A:  Hypotension - reported chronic  but need to r/o acute causes, ? If she is that far from her reported baseline BP Elevated BNP  CHF - listed in history, but normal EF and no diastolic dysfunction on ECHO  P:  Monitor hemodynamics Levophed for BP support - never turned on KVO Continnue midodrine TID    RENAL A:   Acute Renal Failure on CKD - in the setting of volume depletion, concern for acute infection - baseline Cr 1.2 Lactic Acidosis - ? Clearance with AKI   Hyponatremia  P:   Trend BMP / UOP  Replace electrolytes as indicated  Trend lactic acid  GASTROINTESTINAL A:   Abdominal Pain - ongoing x 2 months, ddx includes ischemic colitis, biliary colic, infectious colitis, acute cholecystitis Alcoholic Cirrhosis - MELD score 22 on admit.     Protein Calorie Malnutrition  GERD  P:   Surgery consulted NPO for possible surgical intervention Hold anticoagulation  HEMATOLOGIC A:   Anemia - Hgb from 11/2013 noted to be 11 --> downtrended to 6.6 Elevated INR P:  Assess stool for FOB  Hold DVT ppx Transfuse 1u pRBCs   INFECTIOUS A:   UTI  Abdominal Pain - r/o infectious process  P:   BCx2 10/23 >>  UA 10/23 >> 7-10 wbc, nitrite positive  Zosyn, start date 10/23, day 3/x  ENDOCRINE A:   At Risk Hyper / Hypoglycemia  P:   Monitor glucose on BMP   NEUROLOGIC A:   Pain  Anxiety  P:   RASS goal: n/a PRN Fentanyl for pain   FAMILY  - Updates:  No family available at bedside.  Patient updated on plan of care.  - Inter-disciplinary family meet or Palliative Care meeting due by:  10/24 - confirmed code status with patient.  She wants to further discuss with her husband / family but she would be accepting of mechanical ventilation, feeding tube and ACLS if it would return her to most recent state of health. She would not find it acceptable to live "needing machines".  Erasmo Downer, MD, MPH PGY-2,  [facility] 12/22/2014 7:41 AM  Attending Note:  64 year old female  presenting with abdominal pain.  Abdomen continues to be painful on exam.  Transferred to the ICU for hypotension and need for pressors that were never started.  Midodrine was started.  BP is now stable.  I reviewed CXR myself, no acute disease noted.  Discussed with resident and [facility]-MD.    Hypotension: due to septic shock from abdominal source.  - Midodrine for BP support.  - F/u on U/S studies.  - May need an abdominal CT.  - Surgery consult.  Lactic acidosis: due to sepsis.  Improving.  - Monitor.  - Hydrate.  - Maintain BP.  Abdominal pain: unclear etiology.  - Defer to surgery.  Anemia: due to hydration, no evidence of bleeding.  - Transfuse one unit.  - F/U CBC.  ARI:  - Hydrate.  - Monitor.  Transfer to SDU and to [facility] with PCCM off 10/26.  Patient seen and examined, agree with above note.  I dictated the care and orders written for this patient under my direction.  Alyson Reedy, MD [phone]

## 2014-12-22 NOTE — Progress Notes (Signed)
Patient ID: Abigail Crawford, female   DOB: Oct 10, 1950, 64 y.o.   MRN: [id number]    Subjective: C/o SOB due to COPD and abdominal pain.  She has passed some flatus, but no BM.  No nausea.  Receiving blood this morning.   On midodrine for BP.  Objective: Vital signs in last 24 hours: Temp:  [98.1 F (36.7 C)-98.6 F (37 C)] 98.1 F (36.7 C) (10/25 0605) Pulse Rate:  [61-86] 61 (10/25 0700) Resp:  [14-35] 15 (10/25 0700) BP: (74-151)/(33-92) 107/44 mmHg (10/25 0700) SpO2:  [91 %-99 %] 96 % (10/25 0700) Last BM Date: 12/20/14  Intake/Output from previous day: 10/24 0701 - 10/25 0700 In: 4085 [P.O.:60; I.V.:3590; Blood:335; IV Piggyback:100] Out: 600 [Urine:600] Intake/Output this shift:    PE: Abd: soft, but distended, hypoactive BS, diffusely tender with voluntary guarding. Heart: regular Lungs: Diffuse wheezing and increase WOB, but no significant distress  Lab Results:   Recent Labs  12/21/14 0359 12/22/14 0330  WBC 18.9* 19.8*  HGB 7.6* 6.6*  HCT 24.0* 20.7*  PLT 227 235   BMET  Recent Labs  12/21/14 0359 12/22/14 0330  NA 132* 129*  K 4.8 4.3  CL 99* 99*  CO2 23 20*  GLUCOSE 74 87  BUN 23* 31*  CREATININE 2.45* 2.48*  CALCIUM 8.3* 7.8*   PT/INR  Recent Labs  12/21/14 1018  LABPROT 21.0*  INR 1.81*   CMP     Component Value Date/Time   NA 129* 12/22/2014 0330   K 4.3 12/22/2014 0330   CL 99* 12/22/2014 0330   CO2 20* 12/22/2014 0330   GLUCOSE 87 12/22/2014 0330   BUN 31* 12/22/2014 0330   CREATININE 2.48* 12/22/2014 0330   CALCIUM 7.8* 12/22/2014 0330   PROT 5.5* 12/21/2014 0359   ALBUMIN 2.1* 12/21/2014 0359   AST 29 12/21/2014 0359   ALT 10* 12/21/2014 0359   ALKPHOS 78 12/21/2014 0359   BILITOT 1.2 12/22/2014 0330   GFRNONAA 19* 12/22/2014 0330   GFRAA 22* 12/22/2014 0330   Lipase     Component Value Date/Time   LIPASE 36 12/20/2014 1600       Studies/Results: Ct Abdomen Pelvis Wo Contrast  12/21/2014  CLINICAL DATA:   Chronic substernal chest pain, radiating down to the periumbilical region, worse on the right. Initial encounter. EXAM: CT ABDOMEN AND PELVIS WITHOUT CONTRAST TECHNIQUE: Multidetector CT imaging of the abdomen and pelvis was performed following the standard protocol without IV contrast. COMPARISON:  Abdominal ultrasound 12/20/2014, and CT of the abdomen and pelvis from 12/15/2013 FINDINGS: Mild bibasilar atelectasis is noted. Trace pericardial fluid remains within normal limits. Small to moderate volume ascites is seen within the abdomen and pelvis. There is a diffusely nodular contour to the liver, compatible with hepatic cirrhosis. No definite underlying mass is seen, though evaluation is limited without contrast. The spleen is borderline normal in size. Numerous small stones are seen dependently within the gallbladder. There is mild gallbladder wall thickening, nonspecific in the presence of ascites. The pancreas and adrenal glands are unremarkable in appearance. Mild nonspecific perinephric stranding is noted bilaterally. There is no evidence of hydronephrosis. No renal or ureteral stones are seen. No free fluid is identified. The small bowel is unremarkable in appearance. The stomach is within normal limits. No acute vascular abnormalities are seen. Scattered calcification is noted along the abdominal aorta and its branches. Prominent collateral vessels are noted underlying the right paracolic gutter. The patient is status post appendectomy. There is mild diffuse  wall thickening along the ascending colon. This is concerning for acute infectious or inflammatory colitis. The bladder is mildly distended and grossly unremarkable. The uterus is unremarkable in appearance. The ovaries are grossly symmetric. No suspicious adnexal masses are seen. No inguinal lymphadenopathy is seen. No acute osseous abnormalities are identified. Facet disease is noted at the lower lumbar spine. IMPRESSION: 1. Mild diffuse wall  thickening along the ascending colon, reflecting acute infectious or inflammatory colitis. 2. Small to moderate volume ascites within the abdomen and pelvis. 3. Findings of hepatic cirrhosis. Prominent collateral vessels noted underlying the right paracolic gutter. 4. Cholelithiasis. Mild gallbladder wall thickening is nonspecific in the presence of ascites. 5. Scattered calcification along the abdominal aorta and its branches. 6. Mild bibasilar atelectasis noted. Electronically Signed   By: Roanna Raider M.D.   On: 12/21/2014 01:43   Dg Chest 2 View  12/20/2014  CLINICAL DATA:  Midline chest pain shortness of breath for 2 months EXAM: CHEST - 2 VIEW COMPARISON:  12/15/2013 FINDINGS: Cardiac shadow is within normal limits. Minimal atelectatic changes are noted in the left lung base. No focal infiltrate or sizable effusion is seen. No bony abnormality is noted. IMPRESSION: Minimal left basilar atelectasis. Electronically Signed   By: Alcide Clever M.D.   On: 12/20/2014 16:37   US Abdomen Complete  12/20/2014  CLINICAL DATA:  Abdominal pain EXAM: ULTRASOUND ABDOMEN COMPLETE COMPARISON:  01/19/2014 FINDINGS: Gallbladder: Multiple gallstones are again identified. No significant gallbladder wall thickening is noted. There is a positive sonographic Murphy sign however. Common bile duct: Diameter: 5 mm Liver: Mild heterogeneity as well as nodularity is noted likely representing underlying cirrhotic change IVC: No abnormality visualized. Pancreas: Visualized portion unremarkable. Spleen: Size and appearance within normal limits. Right Kidney: Length: 8.8.  Mild cortical thinning is noted. Left Kidney: Length: 8.5.  Mild cortical thinning is noted. Abdominal aorta: No aneurysm visualized. Other findings: None. IMPRESSION: Changes of hepatic cirrhosis. Multiple gallstones with positive sonographic Murphy's sign. No wall thickening is noted. Mild cortical thinning of the kidneys bilaterally. Electronically Signed   By:  Alcide Clever M.D.   On: 12/20/2014 19:17   US Renal  12/21/2014  CLINICAL DATA:  Patient with acute renal failure. EXAM: RENAL / URINARY TRACT ULTRASOUND COMPLETE COMPARISON:  Abdominal ultrasound 12/21/2014 FINDINGS: Right Kidney: Length: 9.4 cm. No hydronephrosis. Mild increased renal cortical echogenicity. Mild renal cortical thinning. Left Kidney: Length: 9.9 cm. No hydronephrosis. Mild increased renal cortical echogenicity. Mild renal cortical thinning. Bladder: Appears normal for degree of bladder distention. Ascites. IMPRESSION: No hydronephrosis. Mild increased renal cortical echogenicity compatible with chronic medical renal disease. Electronically Signed   By: Annia Belt M.D.   On: 12/21/2014 19:52   US Abdomen Limited  12/21/2014  CLINICAL DATA:  Spontaneous bacterial peritonitis. EXAM: LIMITED ABDOMEN ULTRASOUND FOR ASCITES TECHNIQUE: Limited ultrasound survey for ascites was performed in all four abdominal quadrants. COMPARISON:  Abdomen pelvis CT obtained earlier today. FINDINGS: Previously demonstrated small to moderate amount of free peritoneal fluid. No suitable location of fluid for paracentesis was found. IMPRESSION: Small to moderate amount of ascites without easily accessible fluid for paracentesis. Electronically Signed   By: Beckie Salts M.D.   On: 12/21/2014 10:34   Dg Chest Port 1 View  12/21/2014  CLINICAL DATA:  64 year old female following central line placement. Evaluate proper placement. EXAM: PORTABLE CHEST 1 VIEW COMPARISON:  Chest x-ray 12/20/2014. FINDINGS: Interval placement of a left internal jugular central venous catheter with tip terminating at the superior  cavoatrial junction. No pneumothorax. Lung volumes are low. There are some bibasilar opacities which are favored to reflect subsegmental atelectasis. No definite acute consolidative airspace disease. No pleural effusions. No evidence of pulmonary edema. Heart size is upper limits of normal, accentuated by low  lung volumes and portable AP technique. Upper mediastinal contours are within normal limits. Atherosclerosis in the thoracic aorta. IMPRESSION: 1. Interval placement of left IJ central venous catheter, which appears properly located with tip at the superior cavoatrial junction. 2. No pneumothorax or other acute complicating features. 3. Low lung volumes with bibasilar subsegmental atelectasis. Electronically Signed   By: Trudie Reed M.D.   On: 12/21/2014 18:53    Anti-infectives: Anti-infectives    Start     Dose/Rate Route Frequency Ordered Stop   12/20/14 2300  piperacillin-tazobactam (ZOSYN) IVPB 3.375 g     3.375 g 12.5 mL/hr over 240 Minutes Intravenous Every 8 hours 12/20/14 2139     12/20/14 2200  vancomycin (VANCOCIN) 500 mg in sodium chloride 0.9 % 100 mL IVPB  Status:  Discontinued     500 mg 100 mL/hr over 60 Minutes Intravenous Every 24 hours 12/20/14 2139 12/20/14 2216       Assessment/Plan  1.  Abdominal pain, ? Right sided colitis -last lactic acid level had slightly increased to 3.4.  Patient still with abdominal pain that is about the same this morning. -BP improved on midodrine and levophed -patient seems stable this am, but noted persistent abdominal pain and distention, slight increase in WBC.  Her risks with an operation are huge.  Would like to continue to follow and see if she can improve without an operation. -recheck lactic acid level to see how it's trending -cont abx therapy, zosyn -will follow 2. COPD -c/o SOB today, but will defer to primary service for further management  LOS: 2 days    Crimson Beer E 12/22/2014, 7:56 AM Pager: [phone]

## 2014-12-23 ENCOUNTER — Inpatient Hospital Stay (HOSPITAL_COMMUNITY): Payer: Medicare Other

## 2014-12-23 LAB — BASIC METABOLIC PANEL
ANION GAP: 10 (ref 5–15)
BUN: 28 mg/dL — ABNORMAL HIGH (ref 6–20)
CALCIUM: 7.7 mg/dL — AB (ref 8.9–10.3)
CO2: 19 mmol/L — ABNORMAL LOW (ref 22–32)
Chloride: 103 mmol/L (ref 101–111)
Creatinine, Ser: 2.12 mg/dL — ABNORMAL HIGH (ref 0.44–1.00)
GFR, EST AFRICAN AMERICAN: 27 mL/min — AB (ref >60)
GFR, EST NON AFRICAN AMERICAN: 23 mL/min — AB (ref >60)
GLUCOSE: 83 mg/dL (ref 65–99)
POTASSIUM: 3.7 mmol/L (ref 3.5–5.1)
Sodium: 132 mmol/L — ABNORMAL LOW (ref 135–145)

## 2014-12-23 LAB — CBC
HEMATOCRIT: 24.6 % — AB (ref 36.0–46.0)
HEMOGLOBIN: 8.1 g/dL — AB (ref 12.0–15.0)
MCH: 25.6 pg — ABNORMAL LOW (ref 26.0–34.0)
MCHC: 32.9 g/dL (ref 30.0–36.0)
MCV: 77.8 fL — ABNORMAL LOW (ref 78.0–100.0)
Platelets: 248 10*3/uL (ref 150–400)
RBC: 3.16 MIL/uL — AB (ref 3.87–5.11)
RDW: 17.3 % — ABNORMAL HIGH (ref 11.5–15.5)
WBC: 17.6 10*3/uL — AB (ref 4.0–10.5)

## 2014-12-23 LAB — MAGNESIUM: MAGNESIUM: 1.9 mg/dL (ref 1.7–2.4)

## 2014-12-23 MED ORDER — LEVALBUTEROL HCL 0.63 MG/3ML IN NEBU
0.6300 mg | INHALATION_SOLUTION | RESPIRATORY_TRACT | Status: DC | PRN
  Administered 2014-12-24 – 2014-12-28 (×8): 0.63 mg via RESPIRATORY_TRACT
  Filled 2014-12-23 (×8): qty 3

## 2014-12-23 MED ORDER — PROPRANOLOL HCL 10 MG PO TABS
10.0000 mg | ORAL_TABLET | Freq: Every day | ORAL | Status: DC
  Filled 2014-12-23: qty 1

## 2014-12-23 MED ORDER — DILTIAZEM HCL 100 MG IV SOLR
5.0000 mg/h | INTRAVENOUS | Status: DC
  Administered 2014-12-23: 5 mg/h via INTRAVENOUS
  Filled 2014-12-23 (×2): qty 100

## 2014-12-23 MED ORDER — WHITE PETROLATUM GEL
Status: AC
  Administered 2014-12-23: 22:00:00
  Filled 2014-12-23: qty 1

## 2014-12-23 MED ORDER — DILTIAZEM LOAD VIA INFUSION
10.0000 mg | Freq: Once | INTRAVENOUS | Status: AC
  Administered 2014-12-23: 10 mg via INTRAVENOUS
  Filled 2014-12-23: qty 10

## 2014-12-23 MED ORDER — METOCLOPRAMIDE HCL 5 MG/ML IJ SOLN
5.0000 mg | Freq: Four times a day (QID) | INTRAMUSCULAR | Status: DC
  Administered 2014-12-23 – 2014-12-27 (×16): 5 mg via INTRAVENOUS
  Filled 2014-12-23 (×20): qty 1

## 2014-12-23 MED ORDER — SIMETHICONE 80 MG PO CHEW
80.0000 mg | CHEWABLE_TABLET | Freq: Four times a day (QID) | ORAL | Status: DC
  Administered 2014-12-23 – 2015-01-01 (×33): 80 mg via ORAL
  Filled 2014-12-23 (×40): qty 1

## 2014-12-23 NOTE — Progress Notes (Signed)
Patient ID: Abigail Crawford, female   DOB: 1951/02/26, 64 y.o.   MRN: [id number]    Subjective: Pt loudly moaning constantly this morning.  States her pain acutely worsened this morning around 5:30am.  Taking pain meds around the clock.  Objective: Vital signs in last 24 hours: Temp:  [98 F (36.7 C)-98.9 F (37.2 C)] 98 F (36.7 C) (10/26 0400) Pulse Rate:  [65-80] 80 (10/26 0600) Resp:  [13-28] 28 (10/26 0600) BP: (106-146)/(38-98) 132/52 mmHg (10/26 0600) SpO2:  [95 %-98 %] 96 % (10/26 0600) Last BM Date: 12/20/14  Intake/Output from previous day: 10/25 0701 - 10/26 0700 In: 585 [Blood:335; IV Piggyback:250] Out: 1175 [Urine:1175] Intake/Output this shift:    PE: Abd: soft, but still distended, hurts all over, +BS Heart: regular Lungs: Wheezes  Lab Results:   Recent Labs  12/22/14 0330 12/22/14 1115 12/23/14 0425  WBC 19.8*  --  17.6*  HGB 6.6* 8.2* 8.1*  HCT 20.7* 25.1* 24.6*  PLT 235  --  248   BMET  Recent Labs  12/22/14 0330 12/23/14 0425  NA 129* 132*  K 4.3 3.7  CL 99* 103  CO2 20* 19*  GLUCOSE 87 83  BUN 31* 28*  CREATININE 2.48* 2.12*  CALCIUM 7.8* 7.7*   PT/INR  Recent Labs  12/21/14 1018  LABPROT 21.0*  INR 1.81*   CMP     Component Value Date/Time   NA 132* 12/23/2014 0425   K 3.7 12/23/2014 0425   CL 103 12/23/2014 0425   CO2 19* 12/23/2014 0425   GLUCOSE 83 12/23/2014 0425   BUN 28* 12/23/2014 0425   CREATININE 2.12* 12/23/2014 0425   CALCIUM 7.7* 12/23/2014 0425   PROT 5.5* 12/21/2014 0359   ALBUMIN 2.1* 12/21/2014 0359   AST 29 12/21/2014 0359   ALT 10* 12/21/2014 0359   ALKPHOS 78 12/21/2014 0359   BILITOT 1.2 12/22/2014 0330   GFRNONAA 23* 12/23/2014 0425   GFRAA 27* 12/23/2014 0425   Lipase     Component Value Date/Time   LIPASE 36 12/20/2014 1600       Studies/Results: US Renal  12/21/2014  CLINICAL DATA:  Patient with acute renal failure. EXAM: RENAL / URINARY TRACT ULTRASOUND COMPLETE COMPARISON:   Abdominal ultrasound 12/21/2014 FINDINGS: Right Kidney: Length: 9.4 cm. No hydronephrosis. Mild increased renal cortical echogenicity. Mild renal cortical thinning. Left Kidney: Length: 9.9 cm. No hydronephrosis. Mild increased renal cortical echogenicity. Mild renal cortical thinning. Bladder: Appears normal for degree of bladder distention. Ascites. IMPRESSION: No hydronephrosis. Mild increased renal cortical echogenicity compatible with chronic medical renal disease. Electronically Signed   By: Annia Belt M.D.   On: 12/21/2014 19:52   US Abdomen Limited  12/21/2014  CLINICAL DATA:  Spontaneous bacterial peritonitis. EXAM: LIMITED ABDOMEN ULTRASOUND FOR ASCITES TECHNIQUE: Limited ultrasound survey for ascites was performed in all four abdominal quadrants. COMPARISON:  Abdomen pelvis CT obtained earlier today. FINDINGS: Previously demonstrated small to moderate amount of free peritoneal fluid. No suitable location of fluid for paracentesis was found. IMPRESSION: Small to moderate amount of ascites without easily accessible fluid for paracentesis. Electronically Signed   By: Beckie Salts M.D.   On: 12/21/2014 10:34   Dg Chest Port 1 View  12/21/2014  CLINICAL DATA:  64 year old female following central line placement. Evaluate proper placement. EXAM: PORTABLE CHEST 1 VIEW COMPARISON:  Chest x-ray 12/20/2014. FINDINGS: Interval placement of a left internal jugular central venous catheter with tip terminating at the superior cavoatrial junction. No pneumothorax. Lung volumes  are low. There are some bibasilar opacities which are favored to reflect subsegmental atelectasis. No definite acute consolidative airspace disease. No pleural effusions. No evidence of pulmonary edema. Heart size is upper limits of normal, accentuated by low lung volumes and portable AP technique. Upper mediastinal contours are within normal limits. Atherosclerosis in the thoracic aorta. IMPRESSION: 1. Interval placement of left IJ  central venous catheter, which appears properly located with tip at the superior cavoatrial junction. 2. No pneumothorax or other acute complicating features. 3. Low lung volumes with bibasilar subsegmental atelectasis. Electronically Signed   By: Trudie Reedaniel  Entrikin M.D.   On: 12/21/2014 18:53    Anti-infectives: Anti-infectives    Start     Dose/Rate Route Frequency Ordered Stop   12/22/14 0845  piperacillin-tazobactam (ZOSYN) IVPB 2.25 g     2.25 g 100 mL/hr over 30 Minutes Intravenous 4 times per day 12/22/14 0838     12/20/14 2300  piperacillin-tazobactam (ZOSYN) IVPB 3.375 g  Status:  Discontinued     3.375 g 12.5 mL/hr over 240 Minutes Intravenous Every 8 hours 12/20/14 2139 12/22/14 0838   12/20/14 2200  vancomycin (VANCOCIN) 500 mg in sodium chloride 0.9 % 100 mL IVPB  Status:  Discontinued     500 mg 100 mL/hr over 60 Minutes Intravenous Every 24 hours 12/20/14 2139 12/20/14 2216       Assessment/Plan 1. Abdominal pain, ? Right sided colitis -lactic acid cleared yesterday -BP improved on midodrine and levophed -patient seems to be in more pain this morning.  Will check a portable abdominal film today to rule out free air or something acutely worse given her increase in pain this morning.  Her labs and vitals are all improved though.  Will follow closely.  Patient is a very poor surgical candidate and would not tolerate an operation well. -cont abx therapy, zosyn 2. COPD -per primary service   LOS: 3 days    Mila Pair E 12/23/2014, 7:27 AM Pager: [phone]

## 2014-12-23 NOTE — Progress Notes (Signed)
Abigail HiltsBleeka T Crawford 9:44 AM  Subjective: Patient looks better today but complains of increased abdominal pain and believes she is getting less pain medicine but no new complaints and is breathing better today and case discussed with primary team  Objective: Vital signs stable increased blood pressure no acute distress abdomen seems a little more distended no rebound mild guarding rare bowel sounds little tender throughout x-ray with questionable ileus white count slightly down BUN and creatinine slight improvement no new liver tests or coags  Assessment: Multiple medical problems in patient with cirrhosis  Plan: Might try to re-tap bedside paracentesis for cell count and differential cytology and culture and Gram stain and albumin and repeat liver tests tomorrow with her labs and consider a NG tube if ileus is playing a role with her pain and will continue to follow  [facility],Tinita Brooker E  Pager [phone] After 5PM or if no answer call [phone]

## 2014-12-23 NOTE — Progress Notes (Signed)
UR COMPLETED  

## 2014-12-23 NOTE — Progress Notes (Signed)
[facility] TEAM 1 - Stepdown/ICU TEAM PROGRESS NOTE  Abigail Crawford ZOX:[id number] DOB: 1950-06-14 DOA: 12/20/2014 PCP: [name]  Admit HPI / Brief Narrative: 64 y.o. female with h/o Alcoholic liver cirrhosis presenting with 2 month history of lower chest/abdominal pain-worsening significantly over a few days. Found to be hypotensive with ARF on initial presentation  Significant Events: 10/23 Admit with abdominal pain 10/23 CT ABD > mild diffuse wall thickening along the ascending colon concerning for acute infectious/inflammatory colitis, small to moderate volume ascites within the abdomen and pelvis, hepatic cirrhosis with prominent collateral vessels noted 10/23 ABD [place] > hepatic cirrhosis, multiple gallstones with positive sonographic Murphy sign, no wall thickening noted, mild cortical thinning of the kidneys bilaterally  HPI/Subjective: The patient is alert and interactive at the time my exam.  She requests an adjustment in her pain medication regimen no less than 4 times during my visit.  She denies shortness of breath fevers chills nausea or vomiting.  She complains of bilateral upper quadrant abdominal pain and epigastric/low chest pain.  She states this pain is worse today than it was yesterday.  Assessment/Plan:  Adominal pain / Gaseous distention of stomach Ongoing for 2 months but worse for the 2 days prior to admit - CT Abdomen shows mild ascending colitis - pain is out of proportion to CT findings - IR attempted paracentesis but US showed no drainable ascites - given KUB appearance suggestive of ileus it behooves [place] to avoid escalation of narcotics - I offered the patient an NG tube to decompress at least her stomach but she wishes to avoid this for now - will attempt simethicone and prokinetic agents and follow  Severe Sepsis with hypotension and lactic acidosis ?GI source - no significant drainable ascites to rule out SBP - UA/CXR neg for UTI/PNA - empiric Zosyn to  continue for now - reassess exam in a.m. to determine if diagnostic paracentesis feasible   ARF on CKD Baseline crt ~1.2 - likely pre-renal azotemia or ATN in setting of hypotension/sepsis/diuretics use - slowly improving   Hyponatremia better with IVF - follow  Chronic Microcytic Anemia Baseline Hgb ~11 - hx of GAVE and small esophageal varices (EGD on 08/25/14 at [facility]) - no overt evidence of GI loss - s/p 1U PRBC this admit   Alcoholic Liver Cirrhoses last drink reportedly > 5 years ago - follows with [facility] - MELD score of 24  MRSA Screen +  Code Status: FULL Family Communication: no family present at time of exam Disposition Plan: SDU  Consultants: PCCM Gen Surg GI  Antibiotics: Zosyn 10/23 >   DVT prophylaxis: SQ heparin   Objective: Blood pressure 142/67, pulse 82, temperature 98.6 F (37 C), temperature source Oral, resp. rate 25, height  (1.6 m), weight 53.8 kg (118 lb 9.7 oz), SpO2 98 %.  Intake/Output Summary (Last 24 hours) at 12/23/14 0917 Last data filed at 12/23/14 0800  Gross per 24 hour  Intake    200 ml  Output   1150 ml  Net   -950 ml   Exam: General: No acute respiratory distress Lungs: Clear to auscultation bilaterally without wheezes or crackles - poor air movement bilateral bases Cardiovascular: Regular rate and rhythm without murmur gallop or rub normal S1 and S2 Abdomen: Protuberant, some guarding appreciable, no rebound, bowel sounds high-pitched and present, no appreciable focal mass Extremities: No significant cyanosis, clubbing, or edema bilateral lower extremities  Data Reviewed: Basic Metabolic Panel:  Recent Labs Lab 12/20/14 1600  12/21/14 0359 12/22/14 0330 12/23/14 0425  NA 129* 132* 129* 132*  K 4.6 4.8 4.3 3.7  CL 95* 99* 99* 103  CO2 24 23 20* 19*  GLUCOSE 82 74 87 83  BUN 21* 23* 31* 28*  CREATININE 2.30* 2.45* 2.48* 2.12*  CALCIUM 8.3* 8.3* 7.8* 7.7*  MG  --   --   --  1.9     CBC:  Recent Labs Lab 12/20/14 1600 12/21/14 0359 12/22/14 0330 12/22/14 1115 12/23/14 0425  WBC 11.0* 18.9* 19.8*  --  17.6*  HGB 8.3* 7.6* 6.6* 8.2* 8.1*  HCT 26.6* 24.0* 20.7* 25.1* 24.6*  MCV 77.6* 77.4* 76.4*  --  77.8*  PLT 255 227 235  --  248    Liver Function Tests:  Recent Labs Lab 12/20/14 1600 12/21/14 0359 12/22/14 0330  AST 28 29  --   ALT 12* 10*  --   ALKPHOS 96 78  --   BILITOT 1.4* 1.7* 1.2  PROT 6.3* 5.5*  --   ALBUMIN 2.5* 2.1*  --     Recent Labs Lab 12/20/14 1600  LIPASE 36   Coags:  Recent Labs Lab 12/21/14 1018  INR 1.81*   CBG:  Recent Labs Lab 12/21/14 1727  GLUCAP 84    Recent Results (from the past 240 hour(s))  Culture, blood (routine x 2)     Status: None (Preliminary result)   Collection Time: 12/20/14  8:19 PM  Result Value Ref Range Status   Specimen Description BLOOD RIGHT ARM  Final   Special Requests BOTTLES DRAWN AEROBIC AND ANAEROBIC 10CC  Final   Culture NO GROWTH 2 DAYS  Final   Report Status PENDING  Incomplete  Culture, blood (routine x 2)     Status: None (Preliminary result)   Collection Time: 12/20/14  8:30 PM  Result Value Ref Range Status   Specimen Description BLOOD RIGHT HAND  Final   Special Requests BOTTLES DRAWN AEROBIC AND ANAEROBIC 3CC  Final   Culture NO GROWTH 2 DAYS  Final   Report Status PENDING  Incomplete  MRSA PCR Screening     Status: Abnormal   Collection Time: 12/21/14  1:33 PM  Result Value Ref Range Status   MRSA by PCR POSITIVE (A) NEGATIVE Final    Comment:        The GeneXpert MRSA Assay (FDA approved for NASAL specimens only), is one component of a comprehensive MRSA colonization surveillance program. It is not intended to diagnose MRSA infection nor to guide or monitor treatment for MRSA infections. RESULT CALLED TO, READ BACK BY AND VERIFIED WITH: T. PENNINGTON RN 15:50 12/21/14 ([email or web address])      Studies:   Recent x-ray studies have been reviewed in detail  by the Attending Physician  Scheduled Meds:  Scheduled Meds: . Chlorhexidine Gluconate Cloth  6 each Topical Q0600  . folic acid  1 mg Oral Daily  . ipratropium-albuterol  3 mL Nebulization Q4H  . midodrine  5 mg Oral TID WC  . mupirocin ointment  1 application Nasal BID  . pantoprazole (PROTONIX) IV  40 mg Intravenous Q12H  . piperacillin-tazobactam (ZOSYN)  IV  2.25 g Intravenous 4 times per day    Time spent on care of this patient: 35 mins   Adelisa Satterwhite T , MD   [facility] Office  [phone] Pager - Text Page per Loretha Stapler as per below:  On-Call/Text Page:      [email or web address]      password [email or web address]  If  7PM-7AM, please contact night-coverage [email or web address] Password [email or web address] 12/23/2014, 9:17 AM   LOS: 3 days

## 2014-12-23 NOTE — Progress Notes (Signed)
Patient complained of pain in abdomen, PRN given. Pt asked to get on bed pan to void. While on the bed pan pt complained of difficulty breathing. Respiratory was called and gave breathing treatment. Pt then began running a-fib with RVR. MD notified, new orders placed. Pt was started on Cardizem drip with loading dose per MD order. Pt continues to complain of abdominal pain. NGT placed to left nare to wall low intermittent suction, myself and Raymon MuttonWhitney Davis, RN ausculted for placement and will obtain x-ray to confirm. Pt's NGT not showing output currently.

## 2014-12-24 DIAGNOSIS — A09 Infectious gastroenteritis and colitis, unspecified: Secondary | ICD-10-CM

## 2014-12-24 DIAGNOSIS — R6520 Severe sepsis without septic shock: Secondary | ICD-10-CM | POA: Diagnosis present

## 2014-12-24 DIAGNOSIS — R109 Unspecified abdominal pain: Secondary | ICD-10-CM | POA: Diagnosis present

## 2014-12-24 DIAGNOSIS — A419 Sepsis, unspecified organism: Secondary | ICD-10-CM | POA: Diagnosis present

## 2014-12-24 DIAGNOSIS — D509 Iron deficiency anemia, unspecified: Secondary | ICD-10-CM | POA: Diagnosis present

## 2014-12-24 DIAGNOSIS — N189 Chronic kidney disease, unspecified: Secondary | ICD-10-CM

## 2014-12-24 DIAGNOSIS — E038 Other specified hypothyroidism: Secondary | ICD-10-CM | POA: Diagnosis present

## 2014-12-24 DIAGNOSIS — N179 Acute kidney failure, unspecified: Secondary | ICD-10-CM | POA: Diagnosis present

## 2014-12-24 DIAGNOSIS — K7030 Alcoholic cirrhosis of liver without ascites: Secondary | ICD-10-CM | POA: Diagnosis present

## 2014-12-24 LAB — CBC
HEMATOCRIT: 26.6 % — AB (ref 36.0–46.0)
HEMOGLOBIN: 8.6 g/dL — AB (ref 12.0–15.0)
MCH: 25.1 pg — AB (ref 26.0–34.0)
MCHC: 32.3 g/dL (ref 30.0–36.0)
MCV: 77.8 fL — ABNORMAL LOW (ref 78.0–100.0)
Platelets: 260 10*3/uL (ref 150–400)
RBC: 3.42 MIL/uL — AB (ref 3.87–5.11)
RDW: 17.7 % — ABNORMAL HIGH (ref 11.5–15.5)
WBC: 13.5 10*3/uL — ABNORMAL HIGH (ref 4.0–10.5)

## 2014-12-24 LAB — COMPREHENSIVE METABOLIC PANEL
ALBUMIN: 2.1 g/dL — AB (ref 3.5–5.0)
ALT: 9 U/L — ABNORMAL LOW (ref 14–54)
ANION GAP: 9 (ref 5–15)
AST: 19 U/L (ref 15–41)
Alkaline Phosphatase: 66 U/L (ref 38–126)
BUN: 25 mg/dL — ABNORMAL HIGH (ref 6–20)
CO2: 22 mmol/L (ref 22–32)
Calcium: 8.4 mg/dL — ABNORMAL LOW (ref 8.9–10.3)
Chloride: 105 mmol/L (ref 101–111)
Creatinine, Ser: 1.97 mg/dL — ABNORMAL HIGH (ref 0.44–1.00)
GFR calc Af Amer: 30 mL/min — ABNORMAL LOW (ref >60)
GFR calc non Af Amer: 25 mL/min — ABNORMAL LOW (ref >60)
GLUCOSE: 91 mg/dL (ref 65–99)
POTASSIUM: 3.5 mmol/L (ref 3.5–5.1)
Sodium: 136 mmol/L (ref 135–145)
TOTAL PROTEIN: 5.6 g/dL — AB (ref 6.5–8.1)
Total Bilirubin: 1.9 mg/dL — ABNORMAL HIGH (ref 0.3–1.2)

## 2014-12-24 LAB — TSH: TSH: 5.441 u[IU]/mL — ABNORMAL HIGH (ref 0.350–4.500)

## 2014-12-24 MED ORDER — DILTIAZEM HCL 30 MG PO TABS
30.0000 mg | ORAL_TABLET | Freq: Four times a day (QID) | ORAL | Status: DC
  Administered 2014-12-24 – 2014-12-30 (×25): 30 mg via ORAL
  Filled 2014-12-24 (×29): qty 1

## 2014-12-24 MED ORDER — DILTIAZEM HCL 60 MG PO TABS: 60.0000 mg | ORAL_TABLET | Freq: Four times a day (QID) | ORAL | Status: DC

## 2014-12-24 MED ORDER — PIPERACILLIN-TAZOBACTAM 3.375 G IVPB
3.3750 g | Freq: Three times a day (TID) | INTRAVENOUS | Status: DC
  Administered 2014-12-24 – 2014-12-27 (×9): 3.375 g via INTRAVENOUS
  Filled 2014-12-24 (×11): qty 50

## 2014-12-24 NOTE — Progress Notes (Signed)
Patient ID: Abigail Crawford, female   DOB: [birth year], 64 y.o.   MRN: [id number]    Subjective: Seems more comfortable today.  In bed doing her word search.  Still complains of abdominal pain.  Someone placed an NGT last night, but this has not helped and she has no output.  Objective: Vital signs in last 24 hours: Temp:  [97.8 F (36.6 C)-98.6 F (37 C)] 98 F (36.7 C) (10/27 0723) Pulse Rate:  [65-153] 65 (10/27 0400) Resp:  [15-32] 16 (10/27 0400) BP: (92-149)/(45-78) 101/45 mmHg (10/27 0400) SpO2:  [94 %-100 %] 96 % (10/27 0400) Last BM Date: 12/23/14  Intake/Output from previous day: 10/26 0701 - 10/27 0700 In: 205 [I.V.:105; IV Piggyback:100] Out: 550 [Urine:550] Intake/Output this shift: Total I/O In: -  Out: 100 [Urine:100]  PE: Abd: soft, but still distended, NGT with no output, few BS, diffusely tender  Lab Results:   Recent Labs  12/23/14 0425 12/24/14 0334  WBC 17.6* 13.5*  HGB 8.1* 8.6*  HCT 24.6* 26.6*  PLT 248 260   BMET  Recent Labs  12/23/14 0425 12/24/14 0334  NA 132* 136  K 3.7 3.5  CL 103 105  CO2 19* 22  GLUCOSE 83 91  BUN 28* 25*  CREATININE 2.12* 1.97*  CALCIUM 7.7* 8.4*   PT/INR  Recent Labs  12/21/14 1018  LABPROT 21.0*  INR 1.81*   CMP     Component Value Date/Time   NA 136 12/24/2014 0334   K 3.5 12/24/2014 0334   CL 105 12/24/2014 0334   CO2 22 12/24/2014 0334   GLUCOSE 91 12/24/2014 0334   BUN 25* 12/24/2014 0334   CREATININE 1.97* 12/24/2014 0334   CALCIUM 8.4* 12/24/2014 0334   PROT 5.6* 12/24/2014 0334   ALBUMIN 2.1* 12/24/2014 0334   AST 19 12/24/2014 0334   ALT 9* 12/24/2014 0334   ALKPHOS 66 12/24/2014 0334   BILITOT 1.9* 12/24/2014 0334   GFRNONAA 25* 12/24/2014 0334   GFRAA 30* 12/24/2014 0334   Lipase     Component Value Date/Time   LIPASE 36 12/20/2014 1600       Studies/Results: Dg Abd Portable 1v  12/23/2014  CLINICAL DATA:  Abdominal pain.  Nasogastric tube placement. EXAM: PORTABLE  ABDOMEN - 1 VIEW COMPARISON:  12/23/2014 FINDINGS: A new nasogastric tube is seen with tip overlying the distal gastric antrum. Decrease gaseous distention of stomach is noted. IMPRESSION: Nasogastric tube tip overlies the distal gastric antrum. Electronically Signed   By: Myles Rosenthal M.D.   On: 12/23/2014 20:04   Dg Abd Portable 1v  12/23/2014  CLINICAL DATA:  Acute abdominal pain, cirrhosis, COPD, CHF, asthma, chronic kidney disease EXAM: PORTABLE ABDOMEN - 1 VIEW COMPARISON:  03/09/2014 FINDINGS: Gaseous distention of stomach. Small amount retained contrast in colon. Otherwise normal bowel gas pattern. No bowel dilatation or bowel wall thickening otherwise seen. Bones demineralized. No definite urinary tract calcification. Inferior pelvis excluded. IMPRESSION: Gaseous distention of stomach. Otherwise normal bowel gas pattern. Electronically Signed   By: Ulyses Southward M.D.   On: 12/23/2014 08:42    Anti-infectives: Anti-infectives    Start     Dose/Rate Route Frequency Ordered Stop   12/22/14 0845  piperacillin-tazobactam (ZOSYN) IVPB 2.25 g     2.25 g 100 mL/hr over 30 Minutes Intravenous 4 times per day 12/22/14 0838     12/20/14 2300  piperacillin-tazobactam (ZOSYN) IVPB 3.375 g  Status:  Discontinued     3.375 g 12.5 mL/hr over 240  Minutes Intravenous Every 8 hours 12/20/14 2139 12/22/14 0838   12/20/14 2200  vancomycin (VANCOCIN) 500 mg in sodium chloride 0.9 % 100 mL IVPB  Status:  Discontinued     500 mg 100 mL/hr over 60 Minutes Intravenous Every 24 hours 12/20/14 2139 12/20/14 2216       Assessment/Plan  1. Abdominal pain, ? Right sided colitis vs SBP -BP improved on midodrine and levophed -patient seems to be more comfortable this morning.  Her films yesterday showed a gas filled stomach but no ileus or obstruction.  Doubt NGT is needed, but still defer to whomever placed this for need.  GI has suggested the need for a paracentesis and send for culture to rule out SBP.  She is  already on zosyn though, suspect this may alter culture results, but this may be a consideration.  Will defer further treatment plans to primary serivce -cont abx therapy, zosyn -no surgical indications as all of her objective findings are improving and she clinically seems improved today. 2. COPD -per primary service    LOS: 4 days    Allard Lightsey E 12/24/2014, 8:04 AM Pager: [phone]

## 2014-12-24 NOTE — Progress Notes (Signed)
Abigail Crawford 3:48 PM  Subjective: Patient doing much better and even though not much came out her NG-her x-rays better and she wants something to eat  Objective: Vital signs stable afebrile no acute distress abdomen softer less tender positive bowel sounds labs improved liver tests okay  Assessment: Improved  Plan: Okay to clamp NG and allow clear liquids and consider repeat ultrasound and diagnostic paracentesis just to be sure and if better in a.m. may remove NG and slowly advance diet  [facility],Darlynn Ricco E  Pager [phone] After 5PM or if no answer call [phone]

## 2014-12-24 NOTE — Progress Notes (Signed)
ANTIBIOTIC CONSULT NOTE - FOLLOW UP  Pharmacy Consult for Zosyn Indication: rule out sepsis and SBP  Allergies  Allergen Reactions  . Tylenol [Acetaminophen] Other (See Comments)    Can not take with tramadol-not allergic  . Aspirin Rash  . Cortisone Rash and Other (See Comments)    Red spots.  . Tea Rash   Patient Measurements: Height:  (160 cm) Weight: 118 lb 9.7 oz (53.8 kg) IBW/kg (Calculated) : 52.4 Vital Signs: Temp: 98 F (36.7 C) (10/27 0723) Temp Source: Oral (10/27 0723) BP: 100/71 mmHg (10/27 0723) Pulse Rate: 67 (10/27 0723) Intake/Output from previous day: 10/26 0701 - 10/27 0700 In: 205 [I.V.:105; IV Piggyback:100] Out: 550 [Urine:550] Intake/Output from this shift: Total I/O In: -  Out: 100 [Urine:100]  Labs:  Recent Labs  12/22/14 0330 12/22/14 1115 12/23/14 0425 12/24/14 0334  WBC 19.8*  --  17.6* 13.5*  HGB 6.6* 8.2* 8.1* 8.6*  PLT 235  --  248 260  CREATININE 2.48*  --  2.12* 1.97*   Estimated Creatinine Clearance: 23.6 mL/min (by C-G formula based on Cr of 1.97). No results for input(s): VANCOTROUGH, VANCOPEAK, VANCORANDOM, GENTTROUGH, GENTPEAK, GENTRANDOM, TOBRATROUGH, TOBRAPEAK, TOBRARND, AMIKACINPEAK, AMIKACINTROU, AMIKACIN in the last 72 hours.   Microbiology: Recent Results (from the past 720 hour(s))  Culture, blood (routine x 2)     Status: None (Preliminary result)   Collection Time: 12/20/14  8:19 PM  Result Value Ref Range Status   Specimen Description BLOOD RIGHT ARM  Final   Special Requests BOTTLES DRAWN AEROBIC AND ANAEROBIC 10CC  Final   Culture NO GROWTH 4 DAYS  Final   Report Status PENDING  Incomplete  Culture, blood (routine x 2)     Status: None (Preliminary result)   Collection Time: 12/20/14  8:30 PM  Result Value Ref Range Status   Specimen Description BLOOD RIGHT HAND  Final   Special Requests BOTTLES DRAWN AEROBIC AND ANAEROBIC 3CC  Final   Culture NO GROWTH 4 DAYS  Final   Report Status PENDING   Incomplete  MRSA PCR Screening     Status: Abnormal   Collection Time: 12/21/14  1:33 PM  Result Value Ref Range Status   MRSA by PCR POSITIVE (A) NEGATIVE Final    Comment:        The GeneXpert MRSA Assay (FDA approved for NASAL specimens only), is one component of a comprehensive MRSA colonization surveillance program. It is not intended to diagnose MRSA infection nor to guide or monitor treatment for MRSA infections. RESULT CALLED TO, READ BACK BY AND VERIFIED WITH: T. PENNINGTON RN 15:50 12/21/14 ([email or web address])     Anti-infectives    Start     Dose/Rate Route Frequency Ordered Stop   12/24/14 1300  piperacillin-tazobactam (ZOSYN) IVPB 3.375 g     3.375 g 12.5 mL/hr over 240 Minutes Intravenous 3 times per day 12/24/14 1133     12/22/14 0845  piperacillin-tazobactam (ZOSYN) IVPB 2.25 g  Status:  Discontinued     2.25 g 100 mL/hr over 30 Minutes Intravenous 4 times per day 12/22/14 0838 12/24/14 1133   12/20/14 2300  piperacillin-tazobactam (ZOSYN) IVPB 3.375 g  Status:  Discontinued     3.375 g 12.5 mL/hr over 240 Minutes Intravenous Every 8 hours 12/20/14 2139 12/22/14 0838   12/20/14 2200  vancomycin (VANCOCIN) 500 mg in sodium chloride 0.9 % 100 mL IVPB  Status:  Discontinued     500 mg 100 mL/hr over 60 Minutes Intravenous Every 24 hours  12/20/14 2139 12/20/14 2216      Assessment: 64 year old female with hx cirrhosis admitted with 2 month history of abdominal pain and concern for SBP on Zosyn.   Day #4 of abx for r/o sepsis with abdominal pain poss SBP. Afebrile, WBC elevated at 13.5 but trending down. CT revealed mild diffuse wall thickening along ascending colon (acute inf vs inflam colitis) SCr improving to 1.97 (baseline 1.3), CrCl ~4225ml/min.  Goal of Therapy:  Clinical resolution of infection  Plan:  Change to Zosyn 3.375 gm IV q8h (4 hour infusion) Monitor clinical picture, renal function F/U C&S, abx deescalation / LOT  Abigail Crawford, PharmD Clinical  Pharmacist Pager [phone] 12/24/2014 11:35 AM

## 2014-12-24 NOTE — Progress Notes (Signed)
[place] TEAM 1 - Stepdown/ICU TEAM Progress Note  SEINI LANNOM JXB:[id number] DOB: 05/08/1950 DOA: 12/20/2014 PCP: [name]  Admit HPI / Brief Narrative: 64 y.o. WF PMHx  Anxiety, Alcoholic liver cirrhosis, nicotine abuse, COPD,   Presenting with 2 month history of lower chest/abdominal pain-worsening significantly over a few days. Found to be hypotensive with ARF on initial presentation  HPI/Subjective: 10/27 A/O 4, states currently a smoker but is now going to stop. Not on home O2  Assessment/Plan: Adominal pain / Infectious vs Inflammatory Colitis/Gaseous distention of stomach -Ongoing for 2 months but worse for the 2 days prior to admit  - CT Abdomen shows mild ascending colitis - IR attempted paracentesis but US showed no drainable ascites  - given KUB appearance suggestive of ileus it behooves [place] to avoid escalation of narcotics  -Continue NG tube -Continue nothing by mouth -Continue current antibiotic -Continue Reglan 5 mg QID  Severe Sepsis with hypotension and lactic acidosis -GI source - no significant drainable ascites to rule out SBP - UA/CXR neg for UTI/PNA  - empiric Zosyn to continue for now   Acute on chronic renal failure(Baseline crt ~1.2)  - likely pre-renal azotemia or ATN in setting of hypotension/sepsis/diuretics use, slowly improving   Hyponatremia Resolved   Chronic Microcytic Anemia (Baseline Hgb ~11) - hx of GAVE and small esophageal varices (EGD on 08/25/14 at [facility])  - no overt evidence of GI loss  - s/p 1U PRBC this admit   Alcoholic Liver Cirrhoses -last drink reportedly > 5 years ago - follows with [facility] - MELD score of 24  Subacute Hypothyroidism -TSH> 5 -Free T4 pending; if normal would probably treat given patient's bowel dysmotility   Code Status: FULL Family Communication: no family present at time of exam Disposition Plan: Resolution colitis    Consultants: PCCM Gen  Surg GI  Procedure/Significant Events: 10/23 Admit with abdominal pain 10/23 CT ABD > mild diffuse wall thickening along the ascending colon concerning for acute infectious/inflammatory colitis, small to moderate volume ascites within the abdomen and pelvis, hepatic cirrhosis with prominent collateral vessels noted 10/23 ABD [place] > hepatic cirrhosis, multiple gallstones with positive sonographic Murphy sign, no wall thickening noted, mild cortical thinning of the kidneys bilaterally   Culture 10/23 blood right arm/hand NGTD 10/24 MRSA by PCR negative 10/27 stool pending   Antibiotics: Zosyn 10/23 >   DVT prophylaxis: Subcutaneous heparin   Devices   LINES / TUBES:      Continuous Infusions:   Objective: VITAL SIGNS: Temp: 97.5 F (36.4 C) (10/27 1414) Temp Source: Oral (10/27 1414) BP: 107/88 mmHg (10/27 1431) Pulse Rate: 66 (10/27 1431) SPO2; FIO2:   Intake/Output Summary (Last 24 hours) at 12/24/14 1958 Last data filed at 12/24/14 1158  Gross per 24 hour  Intake    255 ml  Output    250 ml  Net      5 ml     Exam: General: A/O 4, abdominal pain, No acute respiratory distress Eyes: Negative headache, eye pain, double vision,negative scleral hemorrhage ENT: Negative Runny nose, negative ear pain, negative gingival bleeding, Neck:  Negative scars, masses, torticollis, lymphadenopathy, JVD Lungs: diffuse expiratory wheezing, negative crackles Cardiovascular: Regular rate and rhythm without murmur gallop or rub normal S1 and S2 Abdomen: Positive diffuse pain, distended, positive soft, bowel sounds, no rebound, no ascites, no appreciable mass Extremities: No significant cyanosis, clubbing, or edema bilateral lower extremities Psychiatric:  Negative depression, negative anxiety, negative fatigue, negative mania  Neurologic:  Cranial nerves II through XII intact, tongue/uvula midline, all extremities muscle strength 5/5, sensation intact throughout, negative  dysarthria, negative expressive aphasia, negative receptive aphasia.   Data Reviewed: Basic Metabolic Panel:  Recent Labs Lab 12/20/14 1600 12/21/14 0359 12/22/14 0330 12/23/14 0425 12/24/14 0334  NA 129* 132* 129* 132* 136  K 4.6 4.8 4.3 3.7 3.5  CL 95* 99* 99* 103 105  CO2 24 23 20* 19* 22  GLUCOSE 82 74 87 83 91  BUN 21* 23* 31* 28* 25*  CREATININE 2.30* 2.45* 2.48* 2.12* 1.97*  CALCIUM 8.3* 8.3* 7.8* 7.7* 8.4*  MG  --   --   --  1.9  --    Liver Function Tests:  Recent Labs Lab 12/20/14 1600 12/21/14 0359 12/22/14 0330 12/24/14 0334  AST 28 29  --  19  ALT 12* 10*  --  9*  ALKPHOS 96 78  --  66  BILITOT 1.4* 1.7* 1.2 1.9*  PROT 6.3* 5.5*  --  5.6*  ALBUMIN 2.5* 2.1*  --  2.1*    Recent Labs Lab 12/20/14 1600  LIPASE 36   No results for input(s): AMMONIA in the last 168 hours. CBC:  Recent Labs Lab 12/20/14 1600 12/21/14 0359 12/22/14 0330 12/22/14 1115 12/23/14 0425 12/24/14 0334  WBC 11.0* 18.9* 19.8*  --  17.6* 13.5*  HGB 8.3* 7.6* 6.6* 8.2* 8.1* 8.6*  HCT 26.6* 24.0* 20.7* 25.1* 24.6* 26.6*  MCV 77.6* 77.4* 76.4*  --  77.8* 77.8*  PLT 255 227 235  --  248 260   Cardiac Enzymes: No results for input(s): CKTOTAL, CKMB, CKMBINDEX, TROPONINI in the last 168 hours. BNP (last 3 results)  Recent Labs  12/20/14 1600 12/22/14 0330  BNP 1051.1* 1281.2*    ProBNP (last 3 results) No results for input(s): PROBNP in the last 8760 hours.  CBG:  Recent Labs Lab 12/21/14 1727  GLUCAP 84    Recent Results (from the past 240 hour(s))  Culture, blood (routine x 2)     Status: None (Preliminary result)   Collection Time: 12/20/14  8:19 PM  Result Value Ref Range Status   Specimen Description BLOOD RIGHT ARM  Final   Special Requests BOTTLES DRAWN AEROBIC AND ANAEROBIC 10CC  Final   Culture NO GROWTH 4 DAYS  Final   Report Status PENDING  Incomplete  Culture, blood (routine x 2)     Status: None (Preliminary result)   Collection Time:  12/20/14  8:30 PM  Result Value Ref Range Status   Specimen Description BLOOD RIGHT HAND  Final   Special Requests BOTTLES DRAWN AEROBIC AND ANAEROBIC 3CC  Final   Culture NO GROWTH 4 DAYS  Final   Report Status PENDING  Incomplete  MRSA PCR Screening     Status: Abnormal   Collection Time: 12/21/14  1:33 PM  Result Value Ref Range Status   MRSA by PCR POSITIVE (A) NEGATIVE Final    Comment:        The GeneXpert MRSA Assay (FDA approved for NASAL specimens only), is one component of a comprehensive MRSA colonization surveillance program. It is not intended to diagnose MRSA infection nor to guide or monitor treatment for MRSA infections. RESULT CALLED TO, READ BACK BY AND VERIFIED WITH: T. PENNINGTON RN 15:50 12/21/14 ([email or web address])      Studies:  Recent x-ray studies have been reviewed in detail by the Attending Physician  Scheduled Meds:  Scheduled Meds: . Chlorhexidine Gluconate Cloth  6 each Topical Q0600  .  diltiazem  30 mg Oral 4 times per day  . folic acid  1 mg Oral Daily  . metoCLOPramide (REGLAN) injection  5 mg Intravenous Q6H  . midodrine  5 mg Oral TID WC  . mupirocin ointment  1 application Nasal BID  . pantoprazole (PROTONIX) IV  40 mg Intravenous Q12H  . piperacillin-tazobactam (ZOSYN)  IV  3.375 g Intravenous 3 times per day  . simethicone  80 mg Oral QID    Time spent on care of this patient: 40 mins   Nkenge Sonntag, Roselind MessierURTIS J , MD  [facility] Office  [phone] Pager [phone]  On-Call/Text Page:      [email or web address]      password [email or web address]  If 7PM-7AM, please contact night-coverage [email or web address] Password [email or web address] 12/24/2014, 7:58 PM   LOS: 4 days   Care during the described time interval was provided by me .  I have reviewed this patient's available data, including medical history, events of note, physical examination, and all test results as part of my evaluation. I have personally reviewed and interpreted all radiology studies.   Carolyne Littlesurtis Latera Mclin,  MD [phone] Pager

## 2014-12-25 DIAGNOSIS — N189 Chronic kidney disease, unspecified: Secondary | ICD-10-CM

## 2014-12-25 LAB — TYPE AND SCREEN
ABO/RH(D): A POS
ANTIBODY SCREEN: NEGATIVE
UNIT DIVISION: 0
Unit division: 0

## 2014-12-25 LAB — CBC WITH DIFFERENTIAL/PLATELET
BASOS PCT: 0 %
Basophils Absolute: 0 10*3/uL (ref 0.0–0.1)
EOS ABS: 0.3 10*3/uL (ref 0.0–0.7)
Eosinophils Relative: 3 %
HCT: 25.5 % — ABNORMAL LOW (ref 36.0–46.0)
HEMOGLOBIN: 8.2 g/dL — AB (ref 12.0–15.0)
LYMPHS ABS: 1.1 10*3/uL (ref 0.7–4.0)
Lymphocytes Relative: 10 %
MCH: 25.1 pg — AB (ref 26.0–34.0)
MCHC: 32.2 g/dL (ref 30.0–36.0)
MCV: 78 fL (ref 78.0–100.0)
MONO ABS: 1.4 10*3/uL — AB (ref 0.1–1.0)
MONOS PCT: 12 %
NEUTROS ABS: 8.5 10*3/uL — AB (ref 1.7–7.7)
Neutrophils Relative %: 75 %
Platelets: 278 10*3/uL (ref 150–400)
RBC: 3.27 MIL/uL — ABNORMAL LOW (ref 3.87–5.11)
RDW: 18.1 % — AB (ref 11.5–15.5)
WBC: 11.3 10*3/uL — ABNORMAL HIGH (ref 4.0–10.5)

## 2014-12-25 LAB — COMPREHENSIVE METABOLIC PANEL
ALBUMIN: 2.1 g/dL — AB (ref 3.5–5.0)
ALK PHOS: 56 U/L (ref 38–126)
ALT: 8 U/L — ABNORMAL LOW (ref 14–54)
ANION GAP: 8 (ref 5–15)
AST: 17 U/L (ref 15–41)
BILIRUBIN TOTAL: 1.9 mg/dL — AB (ref 0.3–1.2)
BUN: 27 mg/dL — AB (ref 6–20)
CALCIUM: 8.4 mg/dL — AB (ref 8.9–10.3)
CO2: 22 mmol/L (ref 22–32)
CREATININE: 1.92 mg/dL — AB (ref 0.44–1.00)
Chloride: 103 mmol/L (ref 101–111)
GFR, EST AFRICAN AMERICAN: 30 mL/min — AB (ref >60)
GFR, EST NON AFRICAN AMERICAN: 26 mL/min — AB (ref >60)
Glucose, Bld: 94 mg/dL (ref 65–99)
POTASSIUM: 3.3 mmol/L — AB (ref 3.5–5.1)
Sodium: 133 mmol/L — ABNORMAL LOW (ref 135–145)
TOTAL PROTEIN: 5.8 g/dL — AB (ref 6.5–8.1)

## 2014-12-25 LAB — CULTURE, BLOOD (ROUTINE X 2)
Culture: NO GROWTH
Culture: NO GROWTH

## 2014-12-25 LAB — T4, FREE: FREE T4: 1.22 ng/dL — AB (ref 0.61–1.12)

## 2014-12-25 LAB — CK TOTAL AND CKMB (NOT AT ARMC)
CK, MB: 1.9 ng/mL (ref 0.5–5.0)
Relative Index: INVALID (ref 0.0–2.5)
Total CK: 15 U/L — ABNORMAL LOW (ref 38–234)

## 2014-12-25 MED ORDER — POTASSIUM CHLORIDE 10 MEQ/100ML IV SOLN
10.0000 meq | INTRAVENOUS | Status: AC
  Administered 2014-12-25 (×3): 10 meq via INTRAVENOUS
  Filled 2014-12-25 (×3): qty 100

## 2014-12-25 MED ORDER — FENTANYL CITRATE (PF) 100 MCG/2ML IJ SOLN
50.0000 ug | Freq: Once | INTRAMUSCULAR | Status: AC
  Administered 2014-12-25: 50 ug via INTRAVENOUS
  Filled 2014-12-25: qty 2

## 2014-12-25 NOTE — Progress Notes (Signed)
Brain HiltsBleeka T Virts 11:10 AM  Subjective: Patient without any new complaints seemingly tolerating her clamped NG still was some minimal abdominal discomfort  Objective: Vital signs stable afebrile abdomen is soft decreased tenderness some tympany adequate bowels sounds decreased white count  Assessment: Probable SBP and ileus  Plan: Patient probably needs to increase her activity and consider repeat x-ray and possibly removing tube and slowly advancing diet and if ileus is better consider repeat ultrasound and possible paracentesis drawing the usual labs and please call us back if we could be of any further assistance this weekend  [facility] HospitalMAGOD,Zebadiah Willert E  Pager [phone] After 5PM or if no answer call [phone]

## 2014-12-25 NOTE — Progress Notes (Signed)
[place] TEAM 1 - Stepdown/ICU TEAM PROGRESS NOTE  Abigail Crawford:[id number] DOB: Oct 13, 1950 DOA: 12/20/2014 PCP: [name]  Admit HPI / Brief Narrative: 64 y.o. female with h/o Alcoholic liver cirrhosis presenting with 2 month history of lower chest/abdominal pain-worsening significantly over a few days. Found to be hypotensive with ARF on initial presentation  Significant Events: 10/23 Admit with abdominal pain 10/23 CT ABD > mild diffuse wall thickening along the ascending colon concerning for acute infectious/inflammatory colitis, small to moderate volume ascites within the abdomen and pelvis, hepatic cirrhosis with prominent collateral vessels noted 10/23 ABD [place] > hepatic cirrhosis, multiple gallstones with positive sonographic Murphy sign, no wall thickening noted, mild cortical thinning of the kidneys bilaterally  HPI/Subjective: The patient is much more alert and interactive today.  She is very hungry and states she would like to eat.  She denies chest pain shortness of breath or significant abdominal pain at this time.  Assessment/Plan:  Adominal pain / Gaseous distention of stomach Ongoing for 2 months but worse for the 2 days prior to admit - CT Abdomen shows ?of mild ascending colitis - IR attempted paracentesis but US showed no drainable ascites - given KUB appearance suggestive of ileus it behooves [place] to avoid escalation of narcotics - NG was ultimately placed and decompressed the patient's stomach with good symptomatic improvement - will continue simethicone and prokinetic agents and attempt to advance diet today - discontinue NG today  Severe Sepsis with hypotension and lactic acidosis ?GI source - no significant drainable ascites to rule out SBP - UA/CXR neg for UTI/PNA - empiric Zosyn to continue for now - exam suggests volume ascites has not changed and therefore I feel attempts at repeat paracentesis not likely to be successful   ARF on CKD Baseline crt  ~1.2 - likely pre-renal azotemia or ATN in setting of hypotension/sepsis/diuretics use - slowly improving   Hyponatremia better with IVF - follow  Hypokalemia  Replace via IV for now and follow  Transient atrial fibrillation Patient is in normal sinus rhythm at time of exam - continue to monitor on telemetry - with her liver disease she would not be a candidate for anticoagulation even should she prove to have paroxysmal atrial fibrillation  Chronic Microcytic Anemia Baseline Hgb ~11 - hx of GAVE and small esophageal varices (EGD on 08/25/14 at [facility]) - no overt evidence of GI loss - s/p 1U PRBC this admit - continue to follow in serial fashion  Alcoholic Liver Cirrhoses last drink reportedly > 5 years ago - follows with [facility] - MELD score of 24  MRSA Screen +  Code Status: FULL Family Communication: no family present at time of exam Disposition Plan: SDU  Consultants: PCCM Gen Surg GI  Antibiotics: Zosyn 10/23 >   DVT prophylaxis: SQ heparin   Objective: Blood pressure 130/55, pulse 63, temperature 97.9 F (36.6 C), temperature source Oral, resp. rate 16, height  (1.6 m), weight 53.8 kg (118 lb 9.7 oz), SpO2 100 %.  Intake/Output Summary (Last 24 hours) at 12/25/14 1728 Last data filed at 12/25/14 0830  Gross per 24 hour  Intake    460 ml  Output    475 ml  Net    -15 ml   Exam: General: No acute respiratory distress - much more alert and interactive Lungs: Clear to auscultation bilaterally without wheezes or crackles  Cardiovascular: Regular rate and rhythm without murmur gallop or rub  Abdomen: Protuberant, no guarding, no significant tenderness  to deep palpation diffusely, no rebound, bowel sounds high-pitched and present, no appreciable focal mass Extremities: No significant cyanosis, clubbing, edema bilateral lower extremities  Data Reviewed: Basic Metabolic Panel:  Recent Labs Lab 12/21/14 0359 12/22/14 0330 12/23/14 0425  12/24/14 0334 12/25/14 0513  NA 132* 129* 132* 136 133*  K 4.8 4.3 3.7 3.5 3.3*  CL 99* 99* 103 105 103  CO2 23 20* 19* 22 22  GLUCOSE 74 87 83 91 94  BUN 23* 31* 28* 25* 27*  CREATININE 2.45* 2.48* 2.12* 1.97* 1.92*  CALCIUM 8.3* 7.8* 7.7* 8.4* 8.4*  MG  --   --  1.9  --   --     CBC:  Recent Labs Lab 12/21/14 0359 12/22/14 0330 12/22/14 1115 12/23/14 0425 12/24/14 0334 12/25/14 0513  WBC 18.9* 19.8*  --  17.6* 13.5* 11.3*  NEUTROABS  --   --   --   --   --  8.5*  HGB 7.6* 6.6* 8.2* 8.1* 8.6* 8.2*  HCT 24.0* 20.7* 25.1* 24.6* 26.6* 25.5*  MCV 77.4* 76.4*  --  77.8* 77.8* 78.0  PLT 227 235  --  248 260 278    Liver Function Tests:  Recent Labs Lab 12/20/14 1600 12/21/14 0359 12/22/14 0330 12/24/14 0334 12/25/14 0513  AST 28 29  --  19 17  ALT 12* 10*  --  9* 8*  ALKPHOS 96 78  --  66 56  BILITOT 1.4* 1.7* 1.2 1.9* 1.9*  PROT 6.3* 5.5*  --  5.6* 5.8*  ALBUMIN 2.5* 2.1*  --  2.1* 2.1*    Recent Labs Lab 12/20/14 1600  LIPASE 36   Coags:  Recent Labs Lab 12/21/14 1018  INR 1.81*   CBG:  Recent Labs Lab 12/21/14 1727  GLUCAP 84    Recent Results (from the past 240 hour(s))  Culture, blood (routine x 2)     Status: None   Collection Time: 12/20/14  8:19 PM  Result Value Ref Range Status   Specimen Description BLOOD RIGHT ARM  Final   Special Requests BOTTLES DRAWN AEROBIC AND ANAEROBIC 10CC  Final   Culture NO GROWTH 5 DAYS  Final   Report Status 12/25/2014 FINAL  Final  Culture, blood (routine x 2)     Status: None   Collection Time: 12/20/14  8:30 PM  Result Value Ref Range Status   Specimen Description BLOOD RIGHT HAND  Final   Special Requests BOTTLES DRAWN AEROBIC AND ANAEROBIC 3CC  Final   Culture NO GROWTH 5 DAYS  Final   Report Status 12/25/2014 FINAL  Final  MRSA PCR Screening     Status: Abnormal   Collection Time: 12/21/14  1:33 PM  Result Value Ref Range Status   MRSA by PCR POSITIVE (A) NEGATIVE Final    Comment:          The GeneXpert MRSA Assay (FDA approved for NASAL specimens only), is one component of a comprehensive MRSA colonization surveillance program. It is not intended to diagnose MRSA infection nor to guide or monitor treatment for MRSA infections. RESULT CALLED TO, READ BACK BY AND VERIFIED WITH: T. PENNINGTON RN 15:50 12/21/14 ([email or web address])      Studies:   Recent x-ray studies have been reviewed in detail by the Attending Physician  Scheduled Meds:  Scheduled Meds: . Chlorhexidine Gluconate Cloth  6 each Topical Q0600  . diltiazem  30 mg Oral 4 times per day  . folic acid  1 mg Oral Daily  .  metoCLOPramide (REGLAN) injection  5 mg Intravenous Q6H  . midodrine  5 mg Oral TID WC  . mupirocin ointment  1 application Nasal BID  . pantoprazole (PROTONIX) IV  40 mg Intravenous Q12H  . piperacillin-tazobactam (ZOSYN)  IV  3.375 g Intravenous 3 times per day  . simethicone  80 mg Oral QID    Time spent on care of this patient: 35 mins   Abigail Crawford T , MD   [facility] Office  [phone] Pager - Text Page per Loretha Stapler as per below:  On-Call/Text Page:      [email or web address]      password [email or web address]  If 7PM-7AM, please contact night-coverage [email or web address] Password [email or web address] 12/25/2014, 5:28 PM   LOS: 5 days

## 2014-12-25 NOTE — Progress Notes (Signed)
Patient ID: Abigail Crawford, female   DOB: [birth year], 64 y.o.   MRN: [id number]    Subjective: Pt still with some abdominal pain, but overall stable.  No NGT output and NGT currently clamped with no nausea.  Objective: Vital signs in last 24 hours: Temp:  [97.4 F (36.3 C)-98.6 F (37 C)] 98.2 F (36.8 C) (10/28 0700) Pulse Rate:  [66-73] 73 (10/28 0445) Resp:  [15-25] 20 (10/28 0445) BP: (107-138)/(53-88) 137/71 mmHg (10/28 0505) SpO2:  [92 %-95 %] 93 % (10/28 0445) Last BM Date: 12/24/14  Intake/Output from previous day: 10/27 0701 - 10/28 0700 In: 510 [P.O.:360; IV Piggyback:150] Out: 475 [Urine:475] Intake/Output this shift: Total I/O In: -  Out: 100 [Urine:100]  PE: Abd: soft, but still distended, some BS, still tender, but slightly less so today than previous days   Lab Results:   Recent Labs  12/24/14 0334 12/25/14 0513  WBC 13.5* 11.3*  HGB 8.6* 8.2*  HCT 26.6* 25.5*  PLT 260 278   BMET  Recent Labs  12/24/14 0334 12/25/14 0513  NA 136 133*  K 3.5 3.3*  CL 105 103  CO2 22 22  GLUCOSE 91 94  BUN 25* 27*  CREATININE 1.97* 1.92*  CALCIUM 8.4* 8.4*   PT/INR No results for input(s): LABPROT, INR in the last 72 hours. CMP     Component Value Date/Time   NA 133* 12/25/2014 0513   K 3.3* 12/25/2014 0513   CL 103 12/25/2014 0513   CO2 22 12/25/2014 0513   GLUCOSE 94 12/25/2014 0513   BUN 27* 12/25/2014 0513   CREATININE 1.92* 12/25/2014 0513   CALCIUM 8.4* 12/25/2014 0513   PROT 5.8* 12/25/2014 0513   ALBUMIN 2.1* 12/25/2014 0513   AST 17 12/25/2014 0513   ALT 8* 12/25/2014 0513   ALKPHOS 56 12/25/2014 0513   BILITOT 1.9* 12/25/2014 0513   GFRNONAA 26* 12/25/2014 0513   GFRAA 30* 12/25/2014 0513   Lipase     Component Value Date/Time   LIPASE 36 12/20/2014 1600       Studies/Results: Dg Abd Portable 1v  12/23/2014  CLINICAL DATA:  Abdominal pain.  Nasogastric tube placement. EXAM: PORTABLE ABDOMEN - 1 VIEW COMPARISON:  12/23/2014  FINDINGS: A new nasogastric tube is seen with tip overlying the distal gastric antrum. Decrease gaseous distention of stomach is noted. IMPRESSION: Nasogastric tube tip overlies the distal gastric antrum. Electronically Signed   By: Myles Rosenthal M.D.   On: 12/23/2014 20:04    Anti-infectives: Anti-infectives    Start     Dose/Rate Route Frequency Ordered Stop   12/24/14 1300  piperacillin-tazobactam (ZOSYN) IVPB 3.375 g     3.375 g 12.5 mL/hr over 240 Minutes Intravenous 3 times per day 12/24/14 1133     12/22/14 0845  piperacillin-tazobactam (ZOSYN) IVPB 2.25 g  Status:  Discontinued     2.25 g 100 mL/hr over 30 Minutes Intravenous 4 times per day 12/22/14 0838 12/24/14 1133   12/20/14 2300  piperacillin-tazobactam (ZOSYN) IVPB 3.375 g  Status:  Discontinued     3.375 g 12.5 mL/hr over 240 Minutes Intravenous Every 8 hours 12/20/14 2139 12/22/14 0838   12/20/14 2200  vancomycin (VANCOCIN) 500 mg in sodium chloride 0.9 % 100 mL IVPB  Status:  Discontinued     500 mg 100 mL/hr over 60 Minutes Intravenous Every 24 hours 12/20/14 2139 12/20/14 2216       Assessment/Plan   1. Abdominal pain, ? Right sided colitis vs SBP -patient  improving with less tenderness.  Doesn't need NGT from our standpoint.   -cont abx therapy, zosyn -no surgical indications as all of her objective findings are improving and she clinically seems improved today as well. 2. COPD -per primary service   3. Cirrhosis  LOS: 5 days    Aubery Date E 12/25/2014, 9:41 AM Pager: [phone]

## 2014-12-25 NOTE — Progress Notes (Signed)
NGT removed; pt tolerated.

## 2014-12-26 DIAGNOSIS — D509 Iron deficiency anemia, unspecified: Secondary | ICD-10-CM

## 2014-12-26 LAB — CBC
HCT: 26.3 % — ABNORMAL LOW (ref 36.0–46.0)
Hemoglobin: 8.4 g/dL — ABNORMAL LOW (ref 12.0–15.0)
MCH: 25.6 pg — ABNORMAL LOW (ref 26.0–34.0)
MCHC: 31.9 g/dL (ref 30.0–36.0)
MCV: 80.2 fL (ref 78.0–100.0)
PLATELETS: 242 10*3/uL (ref 150–400)
RBC: 3.28 MIL/uL — ABNORMAL LOW (ref 3.87–5.11)
RDW: 18.3 % — AB (ref 11.5–15.5)
WBC: 11.6 10*3/uL — AB (ref 4.0–10.5)

## 2014-12-26 LAB — COMPREHENSIVE METABOLIC PANEL
ALT: 8 U/L — AB (ref 14–54)
AST: 21 U/L (ref 15–41)
Albumin: 2.1 g/dL — ABNORMAL LOW (ref 3.5–5.0)
Alkaline Phosphatase: 66 U/L (ref 38–126)
Anion gap: 12 (ref 5–15)
BILIRUBIN TOTAL: 1.7 mg/dL — AB (ref 0.3–1.2)
BUN: 27 mg/dL — AB (ref 6–20)
CO2: 21 mmol/L — ABNORMAL LOW (ref 22–32)
CREATININE: 2.04 mg/dL — AB (ref 0.44–1.00)
Calcium: 9 mg/dL (ref 8.9–10.3)
Chloride: 101 mmol/L (ref 101–111)
GFR, EST AFRICAN AMERICAN: 28 mL/min — AB (ref >60)
GFR, EST NON AFRICAN AMERICAN: 24 mL/min — AB (ref >60)
Glucose, Bld: 96 mg/dL (ref 65–99)
POTASSIUM: 3.9 mmol/L (ref 3.5–5.1)
Sodium: 134 mmol/L — ABNORMAL LOW (ref 135–145)
TOTAL PROTEIN: 6 g/dL — AB (ref 6.5–8.1)

## 2014-12-26 MED ORDER — HEPARIN SODIUM (PORCINE) 5000 UNIT/ML IJ SOLN
5000.0000 [IU] | Freq: Three times a day (TID) | INTRAMUSCULAR | Status: AC
  Administered 2014-12-26 – 2014-12-27 (×4): 5000 [IU] via SUBCUTANEOUS
  Filled 2014-12-26 (×4): qty 1

## 2014-12-26 NOTE — Evaluation (Signed)
Physical Therapy Evaluation Patient Details Name: Abigail Crawford MRN: [id number] DOB: November 22, 1950 Today's Date: 12/26/2014   History of Present Illness  Pt is a 64 y/o F w/ h/o alcoholic liver cirrhosis who presented w/ 2 month h/o abdominal pain.  Source of abdominal pain likely ileus w/ gaseous distention of stomach.  Additional PMH includes ARF on CKD, CHF, COPD, anxiety, asthma, anemia.  Clinical Impression  Pt admitted with above diagnosis. Pt currently with functional limitations due to the deficits listed below (see PT Problem List). Ms. Windhorst was very please but was limited by generalized weakness/fatigue and abdominal pain.  She ambulated 40 ft w/ walker w/ very dec gait speed. Pt will likely need ST SNF to ensure safety w/ ambulation prior to returning home as she does not have consistent support at home.  Pt very weak and unsafe to return home at this time.  Pt will benefit from skilled PT to increase their independence and safety with mobility to allow discharge to the venue listed below.      Follow Up Recommendations SNF;Supervision/Assistance - 24 hour    Equipment Recommendations  None recommended by PT    Recommendations for Other Services       Precautions / Restrictions Precautions Precautions: Fall Precaution Comments: generalized weakness requiring rest breaks Restrictions Weight Bearing Restrictions: No      Mobility  Bed Mobility               General bed mobility comments: Pt sitting in reciner chair upon PT arrival  Transfers Overall transfer level: Needs assistance Equipment used: Standard walker Transfers: Sit to/from Stand Sit to Stand: Min guard         General transfer comment: Pt is slow to rise 2/2 weakness and requires verbal cues for proper hand placement during sit<>stand transfers from chair and [facility]  Ambulation/Gait Ambulation/Gait assistance: Min guard Ambulation Distance (Feet): 40 Feet Assistive device: Standard walker Gait  Pattern/deviations: Step-through pattern;Decreased stride length;Shuffle;Antalgic;Trunk flexed   Gait velocity interpretation: <1.8 ft/sec, indicative of risk for recurrent falls General Gait Details: Trunk flexed 2/2 abdominal pain and pt w/ very dec gait speed.  Pt required multiple standing rest breaks 2/2 generalized weakness and fatigue.  Close min guard for pt's safety as she has difficult time keeping her eyes all of the way open 2/2 fatigue.  Stairs            Wheelchair Mobility    Modified Rankin (Stroke Patients Only)       Balance Overall balance assessment: Needs assistance Sitting-balance support: Bilateral upper extremity supported;Feet supported Sitting balance-Leahy Scale: Good     Standing balance support: Bilateral upper extremity supported;During functional activity Standing balance-Leahy Scale: Poor Standing balance comment: Relies on standard walker for support                             Pertinent Vitals/Pain Pain Assessment: 0-10 Pain Score: 7  Pain Location: abdomen Pain Descriptors / Indicators: Cramping;Moaning;Grimacing;Guarding Pain Intervention(s): Limited activity within patient's tolerance;Monitored during session;Repositioned    Home Living Family/patient expects to be discharged to:: Skilled nursing facility Living Arrangements: Children;Other relatives (son and grandson) Available Help at Discharge: Family;Available PRN/intermittently Type of Home: Mobile home Home Access: Stairs to enter Entrance Stairs-Rails: Left Entrance Stairs-Number of Steps: 6 Home Layout: One level Home Equipment: Wheelchair - manual;Cane - single point;Walker - 2 wheels Additional Comments: Pt lives w/ son and grandson.  Son goes to work  during the day.  Lucila MaineGrandson is at home most of the day; however pt relays that he is not reliable.  She says she needed to rely on him in the past and he was not consistently helpful.    Prior Function Level of  Independence: Independent with assistive device(s)         Comments: PTA pt using SPC prn when feeling fatigued     Hand Dominance        Extremity/Trunk Assessment   Upper Extremity Assessment: Generalized weakness           Lower Extremity Assessment: Generalized weakness;RLE deficits/detail;LLE deficits/detail RLE Deficits / Details: grossly 3/5 throughout 2/2 generalized weakness/fatigue LLE Deficits / Details: grossly 3/5 throughout 2/2 generalized weakness/fatigue     Communication   Communication: No difficulties  Cognition Arousal/Alertness: Awake/alert Behavior During Therapy: WFL for tasks assessed/performed Overall Cognitive Status: Within Functional Limits for tasks assessed                      General Comments General comments (skin integrity, edema, etc.): Pt will likely need SNF to ensure safety w/ ambulation prior to returning home as she does not have consistent support at home.  Pt very weak and unsafe to return home at this time.    Exercises General Exercises - Lower Extremity Ankle Circles/Pumps: AROM;Both;10 reps;Seated Long Arc Quad: AROM;Both;10 reps;Seated Hip Flexion/Marching: AROM;Both;10 reps;Seated Other Exercises Other Exercises: Encouraged pt to ambulate w/ nursing staff later in the day after resting.      Assessment/Plan    PT Assessment Patient needs continued PT services  PT Diagnosis Difficulty walking;Generalized weakness;Acute pain   PT Problem List Decreased strength;Decreased range of motion;Decreased activity tolerance;Decreased balance;Decreased mobility;Decreased knowledge of use of DME;Decreased safety awareness;Decreased knowledge of precautions;Pain  PT Treatment Interventions DME instruction;Gait training;Stair training;Functional mobility training;Therapeutic activities;Therapeutic exercise;Balance training;Neuromuscular re-education;Patient/family education   PT Goals (Current goals can be found in the  Care Plan section) Acute Rehab PT Goals Patient Stated Goal: to get stronger  PT Goal Formulation: With patient Time For Goal Achievement: 01/16/15 Potential to Achieve Goals: Good    Frequency Min 2X/week   Barriers to discharge Inaccessible home environment;Decreased caregiver support 6 steps to enter home and inconsistent assist at home    Co-evaluation               End of Session Equipment Utilized During Treatment: Gait belt;Oxygen Activity Tolerance: Patient limited by fatigue;Patient limited by pain Patient left: in chair;with call bell/phone within reach Nurse Communication: Mobility status;Precautions (need to use walker when OOB/chair.  Pt very weak)         Time: [id number] PT Time Calculation (min) (ACUTE ONLY): 34 min   Charges:   PT Evaluation $Initial PT Evaluation Tier I: 1 Procedure PT Treatments $Gait Training: 8-22 mins   PT G CodesMichail Jewels:       Vala Raffo Parr PT, DPT [phone] Pager: [phone] 12/26/2014, 10:55 AM

## 2014-12-26 NOTE — Progress Notes (Signed)
Pt went in to afib 80-110's asymptomatic on 30 po of cardizem will continue to monitor

## 2014-12-26 NOTE — Progress Notes (Signed)
[place] TEAM 1 - Stepdown/ICU TEAM PROGRESS NOTE  Brain HiltsBleeka T Mcgue MVH:[id number] DOB: 11/03/1950 DOA: 12/20/2014 PCP: Lilia ArgueKAPLAN,KRISTEN, PA-C  Admit HPI / Brief Narrative: 64 y.o. female with h/o Alcoholic liver cirrhosis presenting with 2 month history of lower chest/abdominal pain-worsening significantly over a few days. Found to be hypotensive with ARF on initial presentation  Significant Events: 10/23 Admit with abdominal pain 10/23 CT ABD > mild diffuse wall thickening along the ascending colon concerning for acute infectious/inflammatory colitis, small to moderate volume ascites within the abdomen and pelvis, hepatic cirrhosis with prominent collateral vessels noted 10/23 ABD US > hepatic cirrhosis, multiple gallstones with positive sonographic Murphy sign, no wall thickening noted, mild cortical thinning of the kidneys bilaterally  HPI/Subjective: The patient has been making frequent requests for pain medications per the RN.  The time of visit she is sleeping heavily in bed.  Upon waking she complains of back pain.  She denies abdominal pain at the present time.  She denies nausea vomiting or shortness of breath.  Assessment/Plan:  Adominal pain / Gaseous distention of stomach Ongoing for 2 months but worse for the 2 days prior to admit - CT Abdomen shows ?of mild ascending colitis - IR attempted paracentesis but US showed no drainable ascites - given KUB appearance suggestive of ileus it behooves us to avoid escalation of narcotics - NG was ultimately placed and decompressed the patient's stomach with good symptomatic improvement - will continue simethicone and prokinetic agents - discontinued NG 10/28 - f/u KUB in AM - GI following with us   Severe Sepsis with hypotension and lactic acidosis ?GI source - no significant drainable ascites to rule out SBP - UA/CXR neg for UTI/PNA - empiric Zosyn to continue for now - if ileus resolved on KUB 10/30 will re-attempt paracentesis    ARF on  CKD Baseline crt ~1.2 - likely pre-renal azotemia or ATN in setting of hypotension/sepsis/diuretics use - crt appears to have stalled at ~2 - follow trend  Hyponatremia Improved with IVF - stable   Hypokalemia  Keep at ~4.0   Transient atrial fibrillation NSR today - continue to monitor on telemetry - with her liver disease she would not be a candidate for anticoagulation even should she prove to have paroxysmal atrial fibrillation  Chronic Microcytic Anemia Baseline Hgb ~11 - hx of GAVE and small esophageal varices (EGD on 08/25/14 at [facility]) - no overt evidence of GI loss - s/p 1U PRBC this admit - continue to follow in serial fashion  Alcoholic Liver Cirrhoses last drink reportedly > 5 years ago - follows with [facility] - MELD score of 24 at admit   MRSA Screen +  Code Status: FULL Family Communication: no family present at time of exam Disposition Plan: SDU  Consultants: PCCM Gen Surg GI  Antibiotics: Zosyn 10/23 >   DVT prophylaxis: SQ heparin   Objective: Blood pressure 114/54, pulse 70, temperature 98.2 F (36.8 C), temperature source Oral, resp. rate 15, height 5\' 3"  (1.6 m), weight 53.8 kg (118 lb 9.7 oz), SpO2 100 %.  Intake/Output Summary (Last 24 hours) at 12/26/14 1736 Last data filed at 12/26/14 1300  Gross per 24 hour  Intake    350 ml  Output    325 ml  Net     25 ml   Exam: General: No acute respiratory distress - somnolent but awakens to voice Lungs: Clear to auscultation bilaterally without wheezes / crackles  Cardiovascular: Regular rate and rhythm without murmur gallop  rub  Abdomen: Protuberant, no guarding, no significant tenderness to deep palpation diffusely, no rebound, bowel sounds present, no appreciable focal mass Extremities: No significant cyanosis, clubbing, or edema bilateral lower extremities  Data Reviewed: Basic Metabolic Panel:  Recent Labs Lab 12/22/14 0330 12/23/14 0425 12/24/14 0334 12/25/14 0513  12/26/14 0600  NA 129* 132* 136 133* 134*  K 4.3 3.7 3.5 3.3* 3.9  CL 99* 103 105 103 101  CO2 20* 19* 22 22 21*  GLUCOSE 87 83 91 94 96  BUN 31* 28* 25* 27* 27*  CREATININE 2.48* 2.12* 1.97* 1.92* 2.04*  CALCIUM 7.8* 7.7* 8.4* 8.4* 9.0  MG  --  1.9  --   --   --     CBC:  Recent Labs Lab 12/22/14 0330 12/22/14 1115 12/23/14 0425 12/24/14 0334 12/25/14 0513 12/26/14 0600  WBC 19.8*  --  17.6* 13.5* 11.3* 11.6*  NEUTROABS  --   --   --   --  8.5*  --   HGB 6.6* 8.2* 8.1* 8.6* 8.2* 8.4*  HCT 20.7* 25.1* 24.6* 26.6* 25.5* 26.3*  MCV 76.4*  --  77.8* 77.8* 78.0 80.2  PLT 235  --  248 260 278 242    Liver Function Tests:  Recent Labs Lab 12/20/14 1600 12/21/14 0359 12/22/14 0330 12/24/14 0334 12/25/14 0513 12/26/14 0600  AST 28 29  --  ALT 12* 10*  --  9* 8* 8*  ALKPHOS 96 78  --  66 56 66  BILITOT 1.4* 1.7* 1.2 1.9* 1.9* 1.7*  PROT 6.3* 5.5*  --  5.6* 5.8* 6.0*  ALBUMIN 2.5* 2.1*  --  2.1* 2.1* 2.1*    Recent Labs Lab 12/20/14 1600  LIPASE 36   Coags:  Recent Labs Lab 12/21/14 1018  INR 1.81*   CBG:  Recent Labs Lab 12/21/14 1727  GLUCAP 84    Recent Results (from the past 240 hour(s))  Culture, blood (routine x 2)     Status: None   Collection Time: 12/20/14  8:19 PM  Result Value Ref Range Status   Specimen Description BLOOD RIGHT ARM  Final   Special Requests BOTTLES DRAWN AEROBIC AND ANAEROBIC 10CC  Final   Culture NO GROWTH 5 DAYS  Final   Report Status 12/25/2014 FINAL  Final  Culture, blood (routine x 2)     Status: None   Collection Time: 12/20/14  8:30 PM  Result Value Ref Range Status   Specimen Description BLOOD RIGHT HAND  Final   Special Requests BOTTLES DRAWN AEROBIC AND ANAEROBIC 3CC  Final   Culture NO GROWTH 5 DAYS  Final   Report Status 12/25/2014 FINAL  Final  MRSA PCR Screening     Status: Abnormal   Collection Time: 12/21/14  1:33 PM  Result Value Ref Range Status   MRSA by PCR POSITIVE (A) NEGATIVE  Final    Comment:        The GeneXpert MRSA Assay (FDA approved for NASAL specimens only), is one component of a comprehensive MRSA colonization surveillance program. It is not intended to diagnose MRSA infection nor to guide or monitor treatment for MRSA infections. RESULT CALLED TO, READ BACK BY AND VERIFIED WITH: T. PENNINGTON RN 15:50 12/21/14 ([email or web address])      Studies:   Recent x-ray studies have been reviewed in detail by the Attending Physician  Scheduled Meds:  Scheduled Meds: . diltiazem  30 mg Oral 4 times per day  . folic acid  1  mg Oral Daily  . metoCLOPramide (REGLAN) injection  5 mg Intravenous Q6H  . midodrine  5 mg Oral TID WC  . pantoprazole (PROTONIX) IV  40 mg Intravenous Q12H  . piperacillin-tazobactam (ZOSYN)  IV  3.375 g Intravenous 3 times per day  . simethicone  80 mg Oral QID    Time spent on care of this patient: 35 mins   MCCLUNG,JEFFREY T , MD   [facility] Office  [phone] Pager - Text Page per Loretha Stapler as per below:  On-Call/Text Page:      [email or web address]      password [email or web address]  If 7PM-7AM, please contact night-coverage [email or web address] Password [email or web address] 12/26/2014, 5:36 PM   LOS: 6 days

## 2014-12-26 NOTE — Progress Notes (Signed)
UR COMPLETED  

## 2014-12-26 NOTE — Progress Notes (Signed)
Patient ID: Abigail Crawford, female   DOB: 1950-05-22, 64 y.o.   MRN: [id number] [facility] Progress [facility]  Abigail Crawford 64 y.o. 1950-05-22   Subjective: Complaining of abdominal pain. No BMs overnight.  Objective: Vital signs in last 24 hours: Filed Vitals:   12/26/14 1114  BP: 132/55  Pulse: 72  Temp: 98.1 F (36.7 C)  Resp: 18    Physical Exam: Gen: lethargic, thin, elderly, uncomfortable HEENT: anicteric CV: RRR Chest: CTA B Abd: distended, diffusely tender with guarding, +BS  Lab Results:  Recent Labs  12/25/14 0513 12/26/14 0600  NA 133* 134*  K 3.3* 3.9  CL 103 101  CO2 22 21*  GLUCOSE 94 96  BUN 27* 27*  CREATININE 1.92* 2.04*  CALCIUM 8.4* 9.0    Recent Labs  12/25/14 0513 12/26/14 0600  AST 17 21  ALT 8* 8*  ALKPHOS 56 66  BILITOT 1.9* 1.7*  PROT 5.8* 6.0*  ALBUMIN 2.1* 2.1*    Recent Labs  12/25/14 0513 12/26/14 0600  WBC 11.3* 11.6*  NEUTROABS 8.5*  --   HGB 8.2* 8.4*  HCT 25.5* 26.3*  MCV 78.0 80.2  PLT 278 242   No results for input(s): LABPROT, INR in the last 72 hours.    Assessment/Plan: Decompensated cirrhosis with ascites and also has ileus. Recheck KUB tomorrow and if ileus has resolved then will do U/S guided paracentesis to look for SBP. Continue supportive care. Will follow.   Staton Markey C. 12/26/2014, 2:00 PM  Pager [phone]  If no answer or after 5 PM call [phone]

## 2014-12-27 ENCOUNTER — Inpatient Hospital Stay (HOSPITAL_COMMUNITY): Payer: Medicare Other

## 2014-12-27 LAB — COMPREHENSIVE METABOLIC PANEL
ALBUMIN: 2 g/dL — AB (ref 3.5–5.0)
ALT: 9 U/L — ABNORMAL LOW (ref 14–54)
ANION GAP: 11 (ref 5–15)
AST: 19 U/L (ref 15–41)
Alkaline Phosphatase: 57 U/L (ref 38–126)
BILIRUBIN TOTAL: 1.9 mg/dL — AB (ref 0.3–1.2)
BUN: 27 mg/dL — ABNORMAL HIGH (ref 6–20)
CHLORIDE: 102 mmol/L (ref 101–111)
CO2: 22 mmol/L (ref 22–32)
Calcium: 9 mg/dL (ref 8.9–10.3)
Creatinine, Ser: 2.29 mg/dL — ABNORMAL HIGH (ref 0.44–1.00)
GFR calc Af Amer: 25 mL/min — ABNORMAL LOW (ref >60)
GFR calc non Af Amer: 21 mL/min — ABNORMAL LOW (ref >60)
GLUCOSE: 89 mg/dL (ref 65–99)
POTASSIUM: 3.6 mmol/L (ref 3.5–5.1)
Sodium: 135 mmol/L (ref 135–145)
TOTAL PROTEIN: 6.2 g/dL — AB (ref 6.5–8.1)

## 2014-12-27 LAB — CBC
HCT: 27 % — ABNORMAL LOW (ref 36.0–46.0)
HEMOGLOBIN: 8.3 g/dL — AB (ref 12.0–15.0)
MCH: 24.6 pg — ABNORMAL LOW (ref 26.0–34.0)
MCHC: 30.7 g/dL (ref 30.0–36.0)
MCV: 80.1 fL (ref 78.0–100.0)
PLATELETS: 254 10*3/uL (ref 150–400)
RBC: 3.37 MIL/uL — AB (ref 3.87–5.11)
RDW: 18.8 % — ABNORMAL HIGH (ref 11.5–15.5)
WBC: 10 10*3/uL (ref 4.0–10.5)

## 2014-12-27 LAB — PHOSPHORUS: Phosphorus: 3.9 mg/dL (ref 2.5–4.6)

## 2014-12-27 LAB — MAGNESIUM: MAGNESIUM: 2 mg/dL (ref 1.7–2.4)

## 2014-12-27 MED ORDER — KCL IN DEXTROSE-NACL 20-5-0.9 MEQ/L-%-% IV SOLN
INTRAVENOUS | Status: DC
  Administered 2014-12-27 – 2014-12-29 (×3): via INTRAVENOUS
  Filled 2014-12-27 (×5): qty 1000

## 2014-12-27 MED ORDER — METOCLOPRAMIDE HCL 5 MG/ML IJ SOLN: 10.0000 mg | Freq: Four times a day (QID) | INTRAMUSCULAR | Status: DC

## 2014-12-27 MED ORDER — PIPERACILLIN-TAZOBACTAM IN DEX 2-0.25 GM/50ML IV SOLN
2.2500 g | Freq: Three times a day (TID) | INTRAVENOUS | Status: DC
  Administered 2014-12-27 – 2015-01-01 (×15): 2.25 g via INTRAVENOUS
  Filled 2014-12-27 (×18): qty 50

## 2014-12-27 MED ORDER — ALPRAZOLAM 0.5 MG PO TABS
0.5000 mg | ORAL_TABLET | Freq: Three times a day (TID) | ORAL | Status: DC | PRN
  Administered 2014-12-27 – 2014-12-31 (×5): 0.5 mg via ORAL
  Filled 2014-12-27 (×5): qty 1

## 2014-12-27 MED ORDER — IOHEXOL 300 MG/ML  SOLN
25.0000 mL | INTRAMUSCULAR | Status: AC
  Administered 2014-12-27 (×2): 25 mL via ORAL

## 2014-12-27 MED ORDER — METOCLOPRAMIDE HCL 5 MG/ML IJ SOLN
5.0000 mg | Freq: Four times a day (QID) | INTRAMUSCULAR | Status: DC
  Administered 2014-12-27 – 2014-12-28 (×4): 5 mg via INTRAVENOUS
  Filled 2014-12-27 (×7): qty 1

## 2014-12-27 MED ORDER — POTASSIUM CHLORIDE 10 MEQ/100ML IV SOLN
10.0000 meq | INTRAVENOUS | Status: DC
  Administered 2014-12-27 (×3): 10 meq via INTRAVENOUS
  Filled 2014-12-27 (×3): qty 100

## 2014-12-27 NOTE — Progress Notes (Signed)
ANTIBIOTIC CONSULT NOTE - FOLLOW UP  Pharmacy Consult for Zosyn Indication: rule out sepsis and SBP  Allergies  Allergen Reactions  . Tylenol [Acetaminophen] Other (See Comments)    Can not take with tramadol-not allergic  . Aspirin Rash  . Cortisone Rash and Other (See Comments)    Red spots.  . Tea Rash   Patient Measurements: Height:  (160 cm) Weight: 118 lb 9.7 oz (53.8 kg) IBW/kg (Calculated) : 52.4 Vital Signs: Temp: 97.9 F (36.6 C) (10/30 1223) Temp Source: Oral (10/30 1223) BP: 101/44 mmHg (10/30 0825) Pulse Rate: 63 (10/30 0825) Intake/Output from previous day: 10/29 0701 - 10/30 0700 In: 110 [P.O.:60; IV Piggyback:50] Out: 550 [Urine:550] Intake/Output from this shift:    Labs:  Recent Labs  12/25/14 0513 12/26/14 0600 12/27/14 0445  WBC 11.3* 11.6* 10.0  HGB 8.2* 8.4* 8.3*  PLT 278 242 254  CREATININE 1.92* 2.04* 2.29*   Estimated Creatinine Clearance: 20.3 mL/min (by C-G formula based on Cr of 2.29). No results for input(s): VANCOTROUGH, VANCOPEAK, VANCORANDOM, GENTTROUGH, GENTPEAK, GENTRANDOM, TOBRATROUGH, TOBRAPEAK, TOBRARND, AMIKACINPEAK, AMIKACINTROU, AMIKACIN in the last 72 hours.   Microbiology: Recent Results (from the past 720 hour(s))  Culture, blood (routine x 2)     Status: None   Collection Time: 12/20/14  8:19 PM  Result Value Ref Range Status   Specimen Description BLOOD RIGHT ARM  Final   Special Requests BOTTLES DRAWN AEROBIC AND ANAEROBIC 10CC  Final   Culture NO GROWTH 5 DAYS  Final   Report Status 12/25/2014 FINAL  Final  Culture, blood (routine x 2)     Status: None   Collection Time: 12/20/14  8:30 PM  Result Value Ref Range Status   Specimen Description BLOOD RIGHT HAND  Final   Special Requests BOTTLES DRAWN AEROBIC AND ANAEROBIC 3CC  Final   Culture NO GROWTH 5 DAYS  Final   Report Status 12/25/2014 FINAL  Final  MRSA PCR Screening     Status: Abnormal   Collection Time: 12/21/14  1:33 PM  Result Value Ref Range  Status   MRSA by PCR POSITIVE (A) NEGATIVE Final    Comment:        The GeneXpert MRSA Assay (FDA approved for NASAL specimens only), is one component of a comprehensive MRSA colonization surveillance program. It is not intended to diagnose MRSA infection nor to guide or monitor treatment for MRSA infections. RESULT CALLED TO, READ BACK BY AND VERIFIED WITH: T. PENNINGTON RN 15:50 12/21/14 ([email or web address])     Anti-infectives    Start     Dose/Rate Route Frequency Ordered Stop   12/24/14 1300  piperacillin-tazobactam (ZOSYN) IVPB 3.375 g     3.375 g 12.5 mL/hr over 240 Minutes Intravenous 3 times per day 12/24/14 1133     12/22/14 0845  piperacillin-tazobactam (ZOSYN) IVPB 2.25 g  Status:  Discontinued     2.25 g 100 mL/hr over 30 Minutes Intravenous 4 times per day 12/22/14 0838 12/24/14 1133   12/20/14 2300  piperacillin-tazobactam (ZOSYN) IVPB 3.375 g  Status:  Discontinued     3.375 g 12.5 mL/hr over 240 Minutes Intravenous Every 8 hours 12/20/14 2139 12/22/14 0838   12/20/14 2200  vancomycin (VANCOCIN) 500 mg in sodium chloride 0.9 % 100 mL IVPB  Status:  Discontinued     500 mg 100 mL/hr over 60 Minutes Intravenous Every 24 hours 12/20/14 2139 12/20/14 2216      Assessment: 64 year old female with hx cirrhosis admitted with 2  month history of abdominal pain and concern for SBP on Zosyn.   Day #7 of abx for r/o sepsis with abdominal pain poss SBP. Afebrile, WBC now wnl. SCr 2.29, previously decreasing, but now increasing (baseline 1.3), CrCl ~5820ml/min.  Goal of Therapy:  Clinical resolution of infection  Plan:  Change to Zosyn 2.25 gm IV q8h  Monitor clinical picture and renal function F/U abx deescalation / LOT  Remi HaggardAlyson N. Rashanda Magloire, PharmD Clinical Pharmacist- Resident Pager: [phone]   12/27/2014 12:30 PM

## 2014-12-27 NOTE — Progress Notes (Signed)
TEAM 1 - Stepdown/ICU TEAM PROGRESS NOTE  Abigail Crawford VHQ:[id number] DOB: 02/15/1951 DOA: 12/20/2014 PCP: Lilia ArgueKAPLAN,KRISTEN, PA-C  Admit HPI / Brief Narrative: 64 y.o. female with h/o Alcoholic liver cirrhosis presenting with 2 month history of lower chest/abdominal pain-worsening significantly over a few days. Found to be hypotensive with ARF on initial presentation  Significant Events: 10/23 Admit with abdominal pain 10/23 CT ABD > mild diffuse wall thickening along the ascending colon concerning for acute infectious/inflammatory colitis, small to moderate volume ascites within the abdomen and pelvis, hepatic cirrhosis with prominent collateral vessels noted 10/23 ABD US > hepatic cirrhosis, multiple gallstones with positive sonographic Murphy sign, no wall thickening noted, mild cortical thinning of the kidneys bilaterally  HPI/Subjective: No new complaints per the patient today.  She does report ongoing abdominal pain but states it is not changed in severity or character.  She denies nausea or vomiting this morning but her intake has been quite limited.  She denies chest pain fevers or chills.  Assessment/Plan:  Adominal pain / Gaseous distention of stomach Ongoing for 2 months but worse for the 2 days prior to admit - initial CT Abdomen noted ?of mild ascending colitis - IR attempted paracentesis but US showed no drainable ascites - NG was ultimately placed and decompressed the patient's stomach with good symptomatic improvement - will continue simethicone and prokinetic agents - discontinued NG 10/28 - f/u KUB today suggests persisting ileus pattern- GI following with us and has ordered repeat CT scan of the abdomen to rule out obstruction - remains on empiric Zosyn  Severe Sepsis with hypotension and lactic acidosis ?GI source - no significant drainable ascites to rule out SBP - UA/CXR neg for UTI/PNA - empiric Zosyn to continue for now - if ileus resolved on KUB 10/30 will  re-attempt paracentesis    ARF on CKD Baseline crt ~1.2 - likely pre-renal azotemia or ATN in setting of hypotension/sepsis/diuretics use - crt appears to have stalled at ~2 - continue to follow trend - resume hydration while NPO  Hyponatremia Improved with IVF - continue to follow  Hypokalemia  Keep at ~4.0 - supplement via IV today  Transient atrial fibrillation NSR today though patient reportedly did have a transient episode of A. fib again last night at approximately 9:45 PM - continue to monitor on telemetry - with her liver disease she is not a candidate for anticoagulation   Chronic Microcytic Anemia Baseline Hgb ~11 - hx of GAVE and small esophageal varices (EGD on 08/25/14 at [facility]) - no overt evidence of GI loss - s/p 1U PRBC this admit - continue to follow in serial fashion  Alcoholic Liver Cirrhoses last drink reportedly > 5 years ago - follows with [facility] - MELD score of 24 at admit   MRSA Screen +  Code Status: FULL Family Communication: no family present at time of exam Disposition Plan: SDU  Consultants: PCCM Gen Surg GI  Antibiotics: Zosyn 10/23 >   DVT prophylaxis: SQ heparin   Objective: Blood pressure 101/44, pulse 63, temperature 98.2 F (36.8 C), temperature source Oral, resp. rate 12, height 5\' 3"  (1.6 m), weight 53.8 kg (118 lb 9.7 oz), SpO2 99 %.  Intake/Output Summary (Last 24 hours) at 12/27/14 1523 Last data filed at 12/27/14 0657  Gross per 24 hour  Intake     60 ml  Output    350 ml  Net   -290 ml   Exam: General: No acute respiratory distress - somnolent  Lungs: Clear to auscultation bilaterally without crackles - poor air movement bilateral bases Cardiovascular: Regular rate and rhythm without murmur gallop or rub  Abdomen: Protuberant, no guarding, modestly tender to deep palpation worse in the right upper quadrant, no rebound, bowel sounds present but high pitched, no appreciable focal mass Extremities: No  significant cyanosis, clubbing, edema bilateral lower extremities  Data Reviewed: Basic Metabolic Panel:  Recent Labs Lab 12/23/14 0425 12/24/14 0334 12/25/14 0513 12/26/14 0600 12/27/14 0445  NA 132* 136 133* 134* 135  K 3.7 3.5 3.3* 3.9 3.6  CL 103 105 103 101 102  CO2 19* 22 22 21* 22  GLUCOSE 83 91 94 96 89  BUN 28* 25* 27* 27* 27*  CREATININE 2.12* 1.97* 1.92* 2.04* 2.29*  CALCIUM 7.7* 8.4* 8.4* 9.0 9.0  MG 1.9  --   --   --  2.0  PHOS  --   --   --   --  3.9    CBC:  Recent Labs Lab 12/23/14 0425 12/24/14 0334 12/25/14 0513 12/26/14 0600 12/27/14 0445  WBC 17.6* 13.5* 11.3* 11.6* 10.0  NEUTROABS  --   --  8.5*  --   --   HGB 8.1* 8.6* 8.2* 8.4* 8.3*  HCT 24.6* 26.6* 25.5* 26.3* 27.0*  MCV 77.8* 77.8* 78.0 80.2 80.1  PLT 248 260 278 242 254    Liver Function Tests:  Recent Labs Lab 12/21/14 0359 12/22/14 0330 12/24/14 0334 12/25/14 0513 12/26/14 0600 12/27/14 0445  AST 29  --  ALT 10*  --  9* 8* 8* 9*  ALKPHOS 78  --  66 56 66 57  BILITOT 1.7* 1.2 1.9* 1.9* 1.7* 1.9*  PROT 5.5*  --  5.6* 5.8* 6.0* 6.2*  ALBUMIN 2.1*  --  2.1* 2.1* 2.1* 2.0*    Recent Labs Lab 12/20/14 1600  LIPASE 36   Coags:  Recent Labs Lab 12/21/14 1018  INR 1.81*   CBG:  Recent Labs Lab 12/21/14 1727  GLUCAP 84    Recent Results (from the past 240 hour(s))  Culture, blood (routine x 2)     Status: None   Collection Time: 12/20/14  8:19 PM  Result Value Ref Range Status   Specimen Description BLOOD RIGHT ARM  Final   Special Requests BOTTLES DRAWN AEROBIC AND ANAEROBIC 10CC  Final   Culture NO GROWTH 5 DAYS  Final   Report Status 12/25/2014 FINAL  Final  Culture, blood (routine x 2)     Status: None   Collection Time: 12/20/14  8:30 PM  Result Value Ref Range Status   Specimen Description BLOOD RIGHT HAND  Final   Special Requests BOTTLES DRAWN AEROBIC AND ANAEROBIC 3CC  Final   Culture NO GROWTH 5 DAYS  Final   Report Status 12/25/2014  FINAL  Final  MRSA PCR Screening     Status: Abnormal   Collection Time: 12/21/14  1:33 PM  Result Value Ref Range Status   MRSA by PCR POSITIVE (A) NEGATIVE Final    Comment:        The GeneXpert MRSA Assay (FDA approved for NASAL specimens only), is one component of a comprehensive MRSA colonization surveillance program. It is not intended to diagnose MRSA infection nor to guide or monitor treatment for MRSA infections. RESULT CALLED TO, READ BACK BY AND VERIFIED WITH: T. PENNINGTON RN 15:50 12/21/14 ([email or web address])      Studies:   Recent x-ray studies have been reviewed in detail by  the Attending Physician  Scheduled Meds:  Scheduled Meds: . diltiazem  30 mg Oral 4 times per day  . folic acid  1 mg Oral Daily  . heparin subcutaneous  5,000 Units Subcutaneous 3 times per day  . iohexol  25 mL Oral Q1 Hr x 2  . metoCLOPramide (REGLAN) injection  5 mg Intravenous Q6H  . midodrine  5 mg Oral TID WC  . pantoprazole (PROTONIX) IV  40 mg Intravenous Q12H  . piperacillin-tazobactam (ZOSYN)  IV  2.25 g Intravenous 3 times per day  . simethicone  80 mg Oral QID    Time spent on care of this patient: 35 mins   Rande Dario T , MD   [facility] Office  [phone] Pager - Text Page per Loretha Stapler as per below:  On-Call/Text Page:      [email or web address]      password [email or web address]  If 7PM-7AM, please contact night-coverage [email or web address] Password [email or web address] 12/27/2014, 3:23 PM   LOS: 7 days

## 2014-12-27 NOTE — Progress Notes (Signed)
Patient ID: Abigail Crawford, female   DOB: 07/18/1950, 64 y.o.   MRN: [id number] [facility]  Abigail Crawford 64 y.o. 07/18/1950   Subjective: No BMs overnight or today. Complaining of abdominal pain. Husband at bedside.  Objective: Vital signs in last 24 hours: Filed Vitals:   12/27/14 1223  BP: 101/44  Pulse: 63  Temp: 97.9 F (36.6 C)  Resp: 12    Physical Exam: Gen: elderly, frail, alert, no acute distress HEENT: anicteric CV: RRR Chest: CTA B Abd: increased distention, hyperactive BS, diffusely tender with guarding Ext: no edema  Lab Results:  Recent Labs  12/26/14 0600 12/27/14 0445  NA 134* 135  K 3.9 3.6  CL 101 102  CO2 21* 22  GLUCOSE 96 89  BUN 27* 27*  CREATININE 2.04* 2.29*  CALCIUM 9.0 9.0  MG  --  2.0  PHOS  --  3.9    Recent Labs  12/26/14 0600 12/27/14 0445  AST 21 19  ALT 8* 9*  ALKPHOS 66 57  BILITOT 1.7* 1.9*  PROT 6.0* 6.2*  ALBUMIN 2.1* 2.0*    Recent Labs  12/25/14 0513 12/26/14 0600 12/27/14 0445  WBC 11.3* 11.6* 10.0  NEUTROABS 8.5*  --   --   HGB 8.2* 8.4* 8.3*  HCT 25.5* 26.3* 27.0*  MCV 78.0 80.2 80.1  PLT 278 242 254   No results for input(s): LABPROT, INR in the last 72 hours.    Assessment/Plan: Alcoholic cirrhosis with a persistent ileus and today has worsened distention and hyperactive bowel sounds concerning for a bowel obstruction. Needs an updated noncontrast CT to look for SBO and colonic obstruction. Hold off on U/S paracentesis at this time. Keep NPO. Supportive care.   Naylene Foell C. 12/27/2014, 1:26 PM  Pager [phone]  If no answer or after 5 PM call [phone]

## 2014-12-28 ENCOUNTER — Other Ambulatory Visit (HOSPITAL_COMMUNITY): Payer: Medicare Other

## 2014-12-28 ENCOUNTER — Inpatient Hospital Stay (HOSPITAL_COMMUNITY): Payer: Medicare Other

## 2014-12-28 DIAGNOSIS — R100 Acute abdomen: Secondary | ICD-10-CM

## 2014-12-28 LAB — COMPREHENSIVE METABOLIC PANEL
ALBUMIN: 2.1 g/dL — AB (ref 3.5–5.0)
ALT: 10 U/L — ABNORMAL LOW (ref 14–54)
ANION GAP: 13 (ref 5–15)
AST: 20 U/L (ref 15–41)
Alkaline Phosphatase: 61 U/L (ref 38–126)
BILIRUBIN TOTAL: 1.8 mg/dL — AB (ref 0.3–1.2)
BUN: 32 mg/dL — ABNORMAL HIGH (ref 6–20)
CO2: 19 mmol/L — AB (ref 22–32)
Calcium: 9.3 mg/dL (ref 8.9–10.3)
Chloride: 104 mmol/L (ref 101–111)
Creatinine, Ser: 3.16 mg/dL — ABNORMAL HIGH (ref 0.44–1.00)
GFR calc non Af Amer: 14 mL/min — ABNORMAL LOW (ref >60)
GFR, EST AFRICAN AMERICAN: 17 mL/min — AB (ref >60)
GLUCOSE: 120 mg/dL — AB (ref 65–99)
POTASSIUM: 4.1 mmol/L (ref 3.5–5.1)
SODIUM: 136 mmol/L (ref 135–145)
TOTAL PROTEIN: 6.5 g/dL (ref 6.5–8.1)

## 2014-12-28 LAB — CBC
HCT: 26.7 % — ABNORMAL LOW (ref 36.0–46.0)
Hemoglobin: 8.2 g/dL — ABNORMAL LOW (ref 12.0–15.0)
MCH: 24.4 pg — ABNORMAL LOW (ref 26.0–34.0)
MCHC: 30.7 g/dL (ref 30.0–36.0)
MCV: 79.5 fL (ref 78.0–100.0)
PLATELETS: 349 10*3/uL (ref 150–400)
RBC: 3.36 MIL/uL — AB (ref 3.87–5.11)
RDW: 19.2 % — ABNORMAL HIGH (ref 11.5–15.5)
WBC: 16.1 10*3/uL — ABNORMAL HIGH (ref 4.0–10.5)

## 2014-12-28 LAB — LACTATE DEHYDROGENASE, PLEURAL OR PERITONEAL FLUID: LD, Fluid: 167 U/L — ABNORMAL HIGH (ref 3–23)

## 2014-12-28 LAB — BODY FLUID CELL COUNT WITH DIFFERENTIAL
LYMPHS FL: 8 %
Monocyte-Macrophage-Serous Fluid: 32 % — ABNORMAL LOW (ref 50–90)
NEUTROPHIL FLUID: 60 % — AB (ref 0–25)
WBC FLUID: 384 uL (ref 0–1000)

## 2014-12-28 LAB — GRAM STAIN

## 2014-12-28 LAB — OCCULT BLOOD X 1 CARD TO LAB, STOOL: FECAL OCCULT BLD: NEGATIVE

## 2014-12-28 LAB — PROTEIN, BODY FLUID: Total protein, fluid: <3 g/dL

## 2014-12-28 LAB — ALBUMIN, FLUID (OTHER)

## 2014-12-28 MED ORDER — LIDOCAINE HCL (PF) 1 % IJ SOLN
INTRAMUSCULAR | Status: AC
  Filled 2014-12-28: qty 10

## 2014-12-28 MED ORDER — ALBUMIN HUMAN 25 % IV SOLN
25.0000 g | Freq: Once | INTRAVENOUS | Status: AC
  Administered 2014-12-28: 25 g via INTRAVENOUS
  Filled 2014-12-28 (×2): qty 100

## 2014-12-28 MED ORDER — MIDODRINE HCL 5 MG PO TABS
10.0000 mg | ORAL_TABLET | Freq: Three times a day (TID) | ORAL | Status: DC
  Administered 2014-12-28 – 2015-01-01 (×11): 10 mg via ORAL
  Filled 2014-12-28 (×14): qty 2

## 2014-12-28 NOTE — Progress Notes (Signed)
[place] TEAM 1 - Stepdown/ICU TEAM PROGRESS NOTE  Abigail Crawford ZOX:[id number] DOB: Oct 21, 1950 DOA: 12/20/2014 PCP: [name]  Admit HPI / Brief Narrative: 64 y.o. female with h/o Alcoholic liver cirrhosis presenting with 2 month history of lower chest/abdominal pain-worsening significantly over a few days. Found to be hypotensive with ARF on initial presentation  Significant Events: 10/23 Admit with abdominal pain 10/23 CT ABD > mild diffuse wall thickening along the ascending colon concerning for acute infectious/inflammatory colitis, small to moderate volume ascites within the abdomen and pelvis, hepatic cirrhosis with prominent collateral vessels noted 10/23 ABD [place] > hepatic cirrhosis, multiple gallstones with positive sonographic Murphy sign, no wall thickening noted, mild cortical thinning of the kidneys bilaterally  HPI/Subjective: The patient is somnolent having just returned from her paracentesis.  She reports decreased abdominal pain with a resultant decreased abdominal distention.  She denies nausea vomiting chest pain or shortness of breath.  Assessment/Plan:  Adominal pain  Ongoing for 2 months but worse for the 2 days prior to admit - initial CT Abdomen noted ?of mild ascending colitis - IR attempted paracentesis at admission but US showed no drainable ascites - NG was ultimately placed and decompressed the patient's stomach with temporary symptomatic improvement - continue simethicone and prokinetic agents - discontinued NG 10/28 - GI following with [place] and ordered repeat CT scan of the abdomen to rule  which revealed no evidence of obstruction but did suggest a probable small bowel ileus as well as large volume ascites - second attempt at paracentesis was made on 1031 and this time was successful in draining 2.7 L with fluid studies currently pending   Severe Sepsis with hypotension and lactic acidosis ?GI source - ascites  studies to rule out SBP pending - UA/CXR  neg for UTI/PNA - empiric Zosyn to continue for now    ARF on CKD Baseline crt ~1.2 - likely pre-renal azotemia or ATN in setting of hypotension/sepsis/diuretics use - crt now climbing again - albumin today in hopes of at least transiently improving perfusion - worrisome for the onset of hepatorenal syndrome - if creatinine continues to climb may need to consider midodrine and octreotide   Hyponatremia Improved with IVF - stable   Hypokalemia  Keep at ~4.0 - stable   Transient atrial fibrillation NSR at time of exam - continue to monitor on telemetry - with her liver disease she is not a candidate for anticoagulation   Chronic Microcytic Anemia Baseline Hgb ~11 - hx of GAVE and small esophageal varices (EGD on 08/25/14 at [facility]) - no overt evidence of GI loss - s/p 1U PRBC this admit - continue to follow in serial fashion  Alcoholic Liver Cirrhoses last drink reportedly > 5 years ago - follows with [facility] - MELD score of 24 at admit - recalculate meld score in a.m.   MRSA Screen +  Code Status: FULL Family Communication: no family present at time of exam Disposition Plan: SDU  Consultants: PCCM Gen Surg GI  Antibiotics: Zosyn 10/23 >   DVT prophylaxis: SQ heparin   Objective: Blood pressure 121/43, pulse 64, temperature 97.7 F (36.5 C), temperature source Oral, resp. rate 10, height  (1.6 m), weight 53.8 kg (118 lb 9.7 oz), SpO2 100 %.  Intake/Output Summary (Last 24 hours) at 12/28/14 1622 Last data filed at 12/28/14 1500  Gross per 24 hour  Intake 1159.58 ml  Output      0 ml  Net 1159.58 ml   Exam:  General: No acute respiratory distress  Lungs: Clear to auscultation bilaterally without crackles Cardiovascular: Regular rate and rhythm without murmur gallop or rub  Abdomen: significantly less protuberant status post paracentesis, no guarding, No significant tenderness to deep palpation today,  no rebound, bowel sounds present but high  pitched, no appreciable focal mass Extremities: No significant cyanosis, clubbing, edema bilateral lower extremities  Data Reviewed: Basic Metabolic Panel:  Recent Labs Lab 12/23/14 0425 12/24/14 0334 12/25/14 0513 12/26/14 0600 12/27/14 0445 12/28/14 0639  NA 132* 136 133* 134* 135 136  K 3.7 3.5 3.3* 3.9 3.6 4.1  CL 103 105 103 101 102 104  CO2 19* 22 22 21* 22 19*  GLUCOSE 83 91 94 96 89 120*  BUN 28* 25* 27* 27* 27* 32*  CREATININE 2.12* 1.97* 1.92* 2.04* 2.29* 3.16*  CALCIUM 7.7* 8.4* 8.4* 9.0 9.0 9.3  MG 1.9  --   --   --  2.0  --   PHOS  --   --   --   --  3.9  --     CBC:  Recent Labs Lab 12/24/14 0334 12/25/14 0513 12/26/14 0600 12/27/14 0445 12/28/14 0639  WBC 13.5* 11.3* 11.6* 10.0 16.1*  NEUTROABS  --  8.5*  --   --   --   HGB 8.6* 8.2* 8.4* 8.3* 8.2*  HCT 26.6* 25.5* 26.3* 27.0* 26.7*  MCV 77.8* 78.0 80.2 80.1 79.5  PLT 260 278 242 254 349    Liver Function Tests:  Recent Labs Lab 12/24/14 0334 12/25/14 0513 12/26/14 0600 12/27/14 0445 12/28/14 0639  AST 19 17 21 19 20   ALT 9* 8* 8* 9* 10*  ALKPHOS 66 56 66 57 61  BILITOT 1.9* 1.9* 1.7* 1.9* 1.8*  PROT 5.6* 5.8* 6.0* 6.2* 6.5  ALBUMIN 2.1* 2.1* 2.1* 2.0* 2.1*   CBG:  Recent Labs Lab 12/21/14 1727  GLUCAP 84    Recent Results (from the past 240 hour(s))  Culture, blood (routine x 2)     Status: None   Collection Time: 12/20/14  8:19 PM  Result Value Ref Range Status   Specimen Description BLOOD RIGHT ARM  Final   Special Requests BOTTLES DRAWN AEROBIC AND ANAEROBIC 10CC  Final   Culture NO GROWTH 5 DAYS  Final   Report Status 12/25/2014 FINAL  Final  Culture, blood (routine x 2)     Status: None   Collection Time: 12/20/14  8:30 PM  Result Value Ref Range Status   Specimen Description BLOOD RIGHT HAND  Final   Special Requests BOTTLES DRAWN AEROBIC AND ANAEROBIC 3CC  Final   Culture NO GROWTH 5 DAYS  Final   Report Status 12/25/2014 FINAL  Final  MRSA PCR Screening      Status: Abnormal   Collection Time: 12/21/14  1:33 PM  Result Value Ref Range Status   MRSA by PCR POSITIVE (A) NEGATIVE Final    Comment:        The GeneXpert MRSA Assay (FDA approved for NASAL specimens only), is one component of a comprehensive MRSA colonization surveillance program. It is not intended to diagnose MRSA infection nor to guide or monitor treatment for MRSA infections. RESULT CALLED TO, READ BACK BY AND VERIFIED WITH: T. PENNINGTON RN 15:50 12/21/14 ([email or web address])   Gram stain     Status: None   Collection Time: 12/28/14 11:18 AM  Result Value Ref Range Status   Specimen Description FLUID PERITONEAL  Final   Special Requests NONE  Final  Gram Stain   Final    MODERATE WBC PRESENT,BOTH PMN AND MONONUCLEAR NO ORGANISMS SEEN    Report Status 12/28/2014 FINAL  Final     Studies:   Recent x-ray studies have been reviewed in detail by the Attending Physician  Scheduled Meds:  Scheduled Meds: . diltiazem  30 mg Oral 4 times per day  . folic acid  1 mg Oral Daily  . lidocaine (PF)      . metoCLOPramide (REGLAN) injection  5 mg Intravenous Q6H  . midodrine  5 mg Oral TID WC  . pantoprazole (PROTONIX) IV  40 mg Intravenous Q12H  . piperacillin-tazobactam (ZOSYN)  IV  2.25 g Intravenous 3 times per day  . simethicone  80 mg Oral QID    Time spent on care of this patient: 35 mins   Abigail Crawford T , MD   [facility] Office  [phone] Pager - Text Page per Loretha Stapler as per below:  On-Call/Text Page:      [email or web address]      password [email or web address]  If 7PM-7AM, please contact night-coverage [email or web address] Password [email or web address] 12/28/2014, 4:22 PM   LOS: 8 days

## 2014-12-28 NOTE — Progress Notes (Signed)
[facility] Progress Note  Subjective: Patient states her abdomen feels better after paracentesis today.  Objective: Vital signs in last 24 hours: Temp:  [97.6 F (36.4 C)-98.2 F (36.8 C)] 97.7 F (36.5 C) (10/31 0700) Pulse Rate:  [64-75] 64 (10/31 1143) Resp:  [10-24] 10 (10/31 1143) BP: (117-133)/(43-67) 121/43 mmHg (10/31 1143) SpO2:  [93 %-100 %] 100 % (10/31 1143) Weight change:    PE:  No acute distress  Heart regular rhythm  Lungs clear  Abdomen soft, some ascites but not tender at this time.  Lab Results: Results for orders placed or performed during the hospital encounter of 12/20/14 (from the past 24 hour(s))  Occult blood card to lab, stool     Status: None   Collection Time: 12/28/14  1:52 AM  Result Value Ref Range   Fecal Occult Bld NEGATIVE NEGATIVE  Comprehensive metabolic panel     Status: Abnormal   Collection Time: 12/28/14  6:39 AM  Result Value Ref Range   Sodium 136 135 - 145 mmol/L   Potassium 4.1 3.5 - 5.1 mmol/L   Chloride 104 101 - 111 mmol/L   CO2 19 (L) 22 - 32 mmol/L   Glucose, Bld 120 (H) 65 - 99 mg/dL   BUN 32 (H) 6 - 20 mg/dL   Creatinine, Ser 8.46 (H) 0.44 - 1.00 mg/dL   Calcium 9.3 8.9 - 96.2 mg/dL   Total Protein 6.5 6.5 - 8.1 g/dL   Albumin 2.1 (L) 3.5 - 5.0 g/dL   AST 20 15 - 41 U/L   ALT 10 (L) 14 - 54 U/L   Alkaline Phosphatase 61 38 - 126 U/L   Total Bilirubin 1.8 (H) 0.3 - 1.2 mg/dL   GFR calc non Af Amer 14 (L) >60 mL/min   GFR calc Af Amer 17 (L) >60 mL/min   Anion gap 13 5 - 15  CBC     Status: Abnormal   Collection Time: 12/28/14  6:39 AM  Result Value Ref Range   WBC 16.1 (H) 4.0 - 10.5 K/uL   RBC 3.36 (L) 3.87 - 5.11 MIL/uL   Hemoglobin 8.2 (L) 12.0 - 15.0 g/dL   HCT 95.2 (L) 84.1 - 32.4 %   MCV 79.5 78.0 - 100.0 fL   MCH 24.4 (L) 26.0 - 34.0 pg   MCHC 30.7 30.0 - 36.0 g/dL   RDW 40.1 (H) 02.7 - 25.3 %   Platelets 349 150 - 400 K/uL  Lactate dehydrogenase (CSF, pleural or peritoneal fluid)      Status: Abnormal   Collection Time: 12/28/14 11:18 AM  Result Value Ref Range   LD, Fluid 167 (H) 3 - 23 U/L   Fluid Type-FLDH Peritoneal   Protein, pleural or peritoneal fluid     Status: None   Collection Time: 12/28/14 11:18 AM  Result Value Ref Range   Total protein, fluid <3.0 g/dL   Fluid Type-FTP Peritoneal   Body fluid cell count with differential     Status: Abnormal   Collection Time: 12/28/14 11:18 AM  Result Value Ref Range   Fluid Type-FCT Peritoneal    Color, Fluid YELLOW (A) YELLOW   Appearance, Fluid HAZY (A) CLEAR   WBC, Fluid 384 0 - 1000 cu mm   Neutrophil Count, Fluid 60 (H) 0 - 25 %   Lymphs, Fluid 8 %   Monocyte-Macrophage-Serous Fluid 32 (L) 50 - 90 %   Other Cells, Fluid MESOTHELIAL CELLS, VACUOLATED NEUTROPHILS %  Gram stain  Status: None   Collection Time: 12/28/14 11:18 AM  Result Value Ref Range   Specimen Description FLUID PERITONEAL    Special Requests NONE    Gram Stain      MODERATE WBC PRESENT,BOTH PMN AND MONONUCLEAR NO ORGANISMS SEEN    Report Status 12/28/2014 FINAL     Studies/Results: Ct Abdomen Pelvis Wo Contrast  12/27/2014  CLINICAL DATA:  Diffuse abdominal pain for the last 3 days. History of CHF, cirrhosis, COPD, chronic kidney disease, appendectomy. EXAM: CT ABDOMEN AND PELVIS WITHOUT CONTRAST TECHNIQUE: Multidetector CT imaging of the abdomen and pelvis was performed following the standard protocol without IV contrast. COMPARISON:  None. FINDINGS: Liver is cirrhotic with nodular peripheral contours. No focal mass or lesion seen within the liver although characterization is limited without intravascular contrast. Spleen, pancreas, and adrenal glands are unremarkable. Kidneys are unremarkable without stone or hydronephrosis. Gallbladder is moderately distended, containing numerous small layering stones. No evidence of a significant bile duct dilatation. There is a large amount of ascites throughout the abdomen and pelvis. No  circumscribed abscess collection seen. No free intraperitoneal air. Majority of the small bowel is mildly distended, however, oral contrast has passed through the entire small bowel and colon without evidence of obstruction. Suspect some degree of small bowel ileus. Partial small bowel obstruction less likely. Thickening of the walls of the small bowel likely reactive to the adjacent ascites. Atherosclerotic changes seen along the walls of the normal-caliber abdominal aorta. Consolidations at each lung base are most likely atelectasis. There are also small pleural effusions bilaterally. Degenerative changes are seen throughout the thoracolumbar spine but no acute osseous abnormality. Ill-defined fluid/ edema is seen throughout the subcutaneous soft tissues consistent with anasarca. IMPRESSION: 1. Liver cirrhosis with associated large volume ascites throughout the abdomen and pelvis. 2. Mildly distended small bowel loops throughout the abdomen. Oral contrast was administered and has passed through the small and large bowel to the rectum thereby excluding a significant bowel obstruction. Suspect small bowel ileus. Partial small bowel obstruction less likely. 3. Thickening of the walls of the small bowel in the left abdomen and central abdomen, most likely reactive to the surrounding ascites. Underlying enteritis cannot be excluded but is less likely. 4. Bibasilar consolidations, right greater than left, with adjacent small pleural effusions. These consolidations are favored to be atelectasis/airspace collapse. If febrile, pneumonia would be a possibility for the right lower lobe consolidation. 5. Cholelithiasis. 6. Anasarca Electronically Signed   By: Bary RichardStan  Maynard M.D.   On: 12/27/2014 17:33      Assessment: Cirrhosis of the liver  Ascites  Plan:   Continue clinical management    Gwenevere AbbotSAM F Adrienne Delay 12/28/2014, 2:32 PM  Pager: [phone] If no answer or after 5 PM call [phone]

## 2014-12-28 NOTE — Clinical Social Work Note (Signed)
Clinical Social Work Assessment  Patient Details  Name: Abigail Crawford MRN: [id number] Date of Birth: 04/02/1950  Date of referral:  12/28/14               Reason for consult:  Facility Placement                Housing/Transportation Living arrangements for the past 2 months:  Mobile Home Source of Information:  Patient Patient Interpreter Needed:  None Criminal Activity/Legal Involvement Pertinent to Current Situation/Hospitalization:  No - Comment as needed Significant Relationships:  Adult Children Lives with:  Self Do you feel safe going back to the place where you live?  No Need for family participation in patient care:  Yes (Comment)  Care giving concerns:  None   Social Worker assessment / plan: CSW met with the pt at the bedside. CSW introduced self and purpose of the visit. CSW discussed SNF rehab. CSW explained insurance and its relation to SNF placement.  CSW provided the pt with a SNF list. The pt reported that she would like to talk to her children first. CSW answered all questions in which the pt inquired about. CSW will continue to follow this pt and assist with discharge as needed.   Employment status:  Disabled (Comment on whether or not currently receiving Disability) Insurance information:  Managed Medicare PT Recommendations:  [place] / Referral to community resources:  [place]  Patient/Family's Response to care:  Pt reported that receiving great care.   Patient/Family's Understanding of and Emotional Response to Diagnosis, Current Treatment, and Prognosis: Pt acknowledged her medical diagnosis and current treatment. Pt acknowledged that she lives along and knows she will need additional aid before going home.    Emotional Assessment Appearance:  Appears stated age Attitude/Demeanor/Rapport:   (Positive) Affect (typically observed):    Orientation:  Oriented to Self, Oriented to Place, Oriented to Situation, Oriented  to  Time Alcohol / Substance use:  Not Applicable Psych involvement (Current and /or in the community):  No (Comment)  Discharge Needs  Concerns to be addressed:  Denies Needs/Concerns at this time Readmission within the last 30 days:  No Current discharge risk:  None Barriers to Discharge:  No Barriers Identified   Zvi Duplantis, LCSW 12/28/2014, 3:03 PM

## 2014-12-28 NOTE — Progress Notes (Signed)
OT Cancellation Note  Patient Details Name: Brain HiltsBleeka T Racca MRN: [id number] DOB: 01/19/1951   Cancelled Treatment:    Reason Eval/Treat Not Completed:  Spoke with nurse and she recommended waiting on therapy right now as she recently had procedure done.  Earlie RavelingStraub, Lurdes Halt[place]anger L  OTR/L [phone]  12/28/2014, 11:48 AM

## 2014-12-28 NOTE — Evaluation (Signed)
Occupational Therapy Evaluation Patient Details Name: Abigail Crawford MRN: [id number] DOB: 02/22/1951 Today's Date: 12/28/2014    History of Present Illness Pt is a 64 y.o. F w/ h/o alcoholic liver cirrhosis who presented w/ 2 month h/o abdominal pain.  Source of abdominal pain likely ileus w/ gaseous distention of stomach.  Additional PMH includes ARF on CKD, CHF, COPD, anxiety, asthma, anemia.   Clinical Impression   Pt admitted with above. Pt independent with ADLs, PTA. Feel pt will benefit from acute OT to increase strength and independence prior to d/c. Recommending SNF for rehab.    Follow Up Recommendations  SNF    Equipment Recommendations  Other (comment) (defer to next venue)    Recommendations for Other Services       Precautions / Restrictions Precautions Precautions: Fall Restrictions Weight Bearing Restrictions: No      Mobility Bed Mobility Overal bed mobility: Needs Assistance Bed Mobility: Supine to Sit;Sit to Supine     Supine to sit: Mod assist Sit to supine: Supervision   General bed mobility comments: assist with hips and trunk to get to EOB.   Transfers Overall transfer level: Needs assistance Equipment used: Standard walker Transfers: Sit to/from Stand;Stand Pivot Transfers Sit to Stand: Min assist Stand pivot transfers: Min guard       General transfer comment: cues for hand placement    Balance      Min guard for stand pivot/pivotal steps-use standard walker. Balance not formally assessed.                                      ADL Overall ADL's : Needs assistance/impaired     Grooming: Wash/dry face;Set up;Supervision/safety;Sitting   Upper Body Bathing: Set up;Supervision/ safety;Sitting           Lower Body Dressing: Sit to/from stand;Moderate assistance   Toilet Transfer: Min guard;Stand-pivot;standard walker;BSC (pivotal steps to [facility])   Toileting- Clothing Manipulation and Hygiene: Sitting/lateral  lean;Supervision/safety;Set up (managing gown/hygiene)       Functional mobility during ADLs: Standard walker;Min guard General ADL Comments:       Vision     Perception     Praxis      Pertinent Vitals/Pain Pain Assessment: 0-10 Pain Score: 4  Pain Location: abdomen and right foot Pain Descriptors / Indicators: Sore Pain Intervention(s): Monitored during session;Repositioned     Hand Dominance     Extremity/Trunk Assessment Upper Extremity Assessment Upper Extremity Assessment: RUE deficits/detail (weakness in bilateral arms) RUE Deficits / Details: weakness in shoulder flexion-less than 90 degrees AROM   Lower Extremity Assessment Lower Extremity Assessment: Defer to PT evaluation       Communication Communication Communication: No difficulties   Cognition Arousal/Alertness: Lethargic Behavior During Therapy: WFL for tasks assessed/performed Overall Cognitive Status: Within Functional Limits for tasks assessed                     General Comments       Exercises       Shoulder Instructions      Home Living Family/patient expects to be discharged to:: Skilled nursing facility Living Arrangements: Children;Other relatives (son and grandson) Available Help at Discharge: Family;Available PRN/intermittently Type of Home: Mobile home Home Access: Stairs to enter Entrance Stairs-Number of Steps: 6 Entrance Stairs-Rails: Left Home Layout: One level               Home  Equipment: Wheelchair - manual;Cane - single point;Walker - 2 wheels   Additional Comments: Pt lives w/ son and grandson.  Son goes to work during the day.  Lucila Maine is at home most of the day; however pt relays that he is not reliable.  She says she needed to rely on him in the past and he was not consistently helpful.      Prior Functioning/Environment Level of Independence: Independent with assistive device(s)        Comments: PTA pt using SPC prn when feeling fatigued     OT Diagnosis: Generalized weakness;Acute pain   OT Problem List: Decreased strength;Decreased activity tolerance;Impaired balance (sitting and/or standing);Pain;Decreased knowledge of precautions;Decreased knowledge of use of DME or AE   OT Treatment/Interventions: Self-care/ADL training;DME and/or AE instruction;Patient/family education;Balance training;Therapeutic exercise;Energy conservation;Therapeutic activities    OT Goals(Current goals can be found in the care plan section) Acute Rehab OT Goals Patient Stated Goal: not stated OT Goal Formulation: With patient Time For Goal Achievement: 2015-02-03 Potential to Achieve Goals: Good ADL Goals Pt Will Perform Lower Body Bathing: sit to/from stand;with supervision;with set-up Pt Will Perform Lower Body Dressing: sit to/from stand;with set-up;with supervision Pt Will Transfer to Toilet: with supervision;ambulating Pt Will Perform Toileting - Clothing Manipulation and hygiene: with set-up;with supervision;sit to/from stand Additional ADL Goal #1: Pt will independently perform HEP for bilateral UEs to increase strength.  OT Frequency: Min 2X/week   Barriers to D/C:            Co-evaluation              End of Session Equipment Utilized During Treatment: Gait belt;Oxygen (standard walker) Nurse Communication: Mobility status (asking about having water; pt lethargic)  Activity Tolerance: Patient limited by fatigue (also lethargic) Patient left: in bed;with call bell/phone within reach;with bed alarm set   Time: 1610-9604 OT Time Calculation (min): 18 min Charges:  OT General Charges $OT Visit: 1 Procedure OT Evaluation $Initial OT Evaluation Tier I: 1 Procedure G-CodesEarlie Raveling OTR/L [id number] 12/28/2014, 4:46 PM

## 2014-12-28 NOTE — NC FL2 (Signed)
[place] MEDICAID FL2 LEVEL OF CARE SCREENING TOOL     IDENTIFICATION  Patient Name: Abigail Crawford Birthdate: 06/18/1950 Sex: female Admission Date (Current Location): 12/20/2014  [facility] [place]:     Facility and Address:  [facility], [address], [place], [place] [address]      Provider Number: [id number]  Attending Physician Name and Address:  Lonia BloodJeffrey T McClung, MD  Relative Name and Phone Number:       Current Level of Care: Hospital Recommended Level of Care: Skilled Nursing Facility Prior Approval Number:    Date Approved/Denied:   PASRR Number: [id number]  Discharge Plan: SNF    Current Diagnoses: Patient Active Problem List   Diagnosis Date Noted  . Acute abdominal pain   . Infectious colitis   . Severe sepsis (HCC)   . Acute on chronic renal failure (HCC)   . Microcytic anemia   . Alcoholic cirrhosis of liver without ascites (HCC)   . Other specified hypothyroidism   . Encounter for central line placement   . Abdominal pain 12/20/2014  . Hypotension 12/20/2014  . AKI (acute kidney injury) (HCC) 12/20/2014  . Hyponatremia 02/28/2011  . Diarrhea 02/28/2011  . Liver cirrhosis (HCC) 02/28/2011  . UTI (lower urinary tract infection) 02/28/2011  . Weakness generalized 02/28/2011    Orientation ACTIVITIES/SOCIAL BLADDER RESPIRATION    Time, Self, Situation, Place  Active Continent Normal  BEHAVIORAL SYMPTOMS/MOOD NEUROLOGICAL BOWEL NUTRITION STATUS      Continent Diet (NPO)  PHYSICIAN VISITS COMMUNICATION OF NEEDS Height & Weight Skin    Verbally 5\' 3"  (160 cm) 118 lbs. Normal          AMBULATORY STATUS RESPIRATION     Gaffer(Limited Assistant ) Normal      Personal Care Assistance Level of Assistance  Bathing, Dressing Bathing Assistance: Limited assistance   Dressing Assistance: Limited assistance      Functional Limitations Info                SPECIAL CARE FACTORS FREQUENCY  PT (By licensed  PT), OT (By licensed OT)                   Additional Factors Info  Code Status, Allergies, Isolation Precautions Code Status Info: FULL CODE Allergies Info: Allergies: Tylenol, Aspirin, Cortisone, Tea     Isolation Precautions Info: MRSA     Current Medications (12/28/2014): Current Facility-Administered Medications  Medication Dose Route Frequency Provider Last Rate Last Dose  . ALPRAZolam Prudy Feeler(XANAX) tablet 0.5 mg  0.5 mg Oral TID PRN Lonia BloodJeffrey T McClung, MD   0.5 mg at 12/27/14 2252  . dextrose 5 % and 0.9 % NaCl with KCl 20 mEq/L infusion   Intravenous Continuous Lonia BloodJeffrey T McClung, MD 50 mL/hr at 12/28/14 1500    . diltiazem (CARDIZEM) tablet 30 mg  30 mg Oral 4 times per day Leda GauzeKaren J Kirby-Graham, NP   30 mg at 12/28/14 1142  . fentaNYL (SUBLIMAZE) injection 25 mcg  25 mcg Intravenous Q2H PRN Oretha Milchakesh Alva V, MD   25 mcg at 12/28/14 1443  . folic acid (FOLVITE) tablet 1 mg  1 mg Oral Daily Hillary BowJared M Gardner, DO   1 mg at 12/28/14 0920  . levalbuterol (XOPENEX) nebulizer solution 0.63 mg  0.63 mg Nebulization Q3H PRN Lonia BloodJeffrey T McClung, MD   0.63 mg at 12/28/14 0425  . lidocaine (PF) (XYLOCAINE) 1 % injection           .  metoCLOPramide (REGLAN) injection 5 mg  5 mg Intravenous Q6H Lonia Blood, MD   5 mg at 12/28/14 1409  . midodrine (PROAMATINE) tablet 5 mg  5 mg Oral TID WC Jeanella Craze, NP   5 mg at 12/28/14 1141  . pantoprazole (PROTONIX) injection 40 mg  40 mg Intravenous Q12H Maretta Bees, MD   40 mg at 12/28/14 0920  . piperacillin-tazobactam (ZOSYN) IVPB 2.25 g  2.25 g Intravenous 3 times per day Remi Haggard, RPH   2.25 g at 12/28/14 1428  . simethicone (MYLICON) chewable tablet 80 mg  80 mg Oral QID Barnetta Chapel, PA-C   80 mg at 12/28/14 1409   Do not use this list as official medication orders. Please verify with discharge summary.  Discharge Medications:   Medication List    ASK your doctor about these medications        albuterol 108 (90 BASE) MCG/ACT  inhaler  Commonly known as:  PROVENTIL HFA;VENTOLIN HFA  Inhale 2 puffs into the lungs every 4 (four) hours. Scheduled     ALPRAZolam 0.5 MG tablet  Commonly known as:  XANAX  Take 0.5 mg by mouth 4 (four) times daily as needed for anxiety or sleep.     esomeprazole 40 MG capsule  Commonly known as:  NEXIUM  Take 40 mg by mouth daily before breakfast.     folic acid 1 MG tablet  Commonly known as:  FOLVITE  Take 1 mg by mouth daily.     furosemide 20 MG tablet  Commonly known as:  LASIX  Take 20 mg by mouth daily.     ibuprofen 200 MG tablet  Commonly known as:  ADVIL,MOTRIN  Take 200 mg by mouth every 4 (four) hours as needed for moderate pain.     pantoprazole 40 MG tablet  Commonly known as:  PROTONIX  Take 40 mg by mouth daily.     polyethylene glycol packet  Commonly known as:  MIRALAX / GLYCOLAX  Take 17 g by mouth at bedtime.     propranolol 10 MG tablet  Commonly known as:  INDERAL  Take 10 mg by mouth daily.     spironolactone 25 MG tablet  Commonly known as:  ALDACTONE  Take 25 mg by mouth daily.     thiamine 100 MG tablet  Commonly known as:  VITAMIN B-1  Take 100 mg by mouth daily.     traMADol 50 MG tablet  Commonly known as:  ULTRAM  Take 50 mg by mouth every 6 (six) hours. Maximum dose= 8 tablets per day     traZODone 50 MG tablet  Commonly known as:  DESYREL  Take 50-100 mg by mouth at bedtime.        Relevant Imaging Results:  Relevant Lab Results:  Recent Labs    Additional Information    Abigail Campi, LCSW

## 2014-12-28 NOTE — Procedures (Signed)
   US guided RLQ para  2.7 L yellow fluid  Sent for labs per MD  Tolerated well

## 2014-12-28 NOTE — Clinical Social Work Placement (Signed)
   CLINICAL SOCIAL WORK PLACEMENT  NOTE  Date:  12/28/2014  Patient Details  Name: Brain HiltsBleeka T Polinsky MRN: [id number] Date of Birth: 1950/03/14  Clinical Social Work is seeking post-discharge placement for this patient at the Skilled  Nursing Facility level of care (*CSW will initial, date and re-position this form in  chart as items are completed):  Yes   Patient/family provided with [facility] Clinical Social Work Department's list of facilities offering this level of care within the geographic area requested by the patient (or if unable, by the patient's family).  Yes   Patient/family informed of their freedom to choose among providers that offer the needed level of care, that participate in Medicare, Medicaid or managed care program needed by the patient, have an available bed and are willing to accept the patient.  Yes   Patient/family informed of [facility]'s ownership interest in [facility] and [facility], as well as of the fact that they are under no obligation to receive care at these facilities.  PASRR submitted to EDS on 12/28/14     PASRR number received on 12/28/14     Existing PASRR number confirmed on       FL2 transmitted to all facilities in geographic area requested by pt/family on 12/28/14     FL2 transmitted to all facilities within larger geographic area on       Patient informed that his/her managed care company has contracts with or will negotiate with certain facilities, including the following:            Patient/family informed of bed offers received.  Patient chooses bed at       Physician recommends and patient chooses bed at      Patient to be transferred to   on  .  Patient to be transferred to facility by       Patient family notified on   of transfer.  Name of family member notified:        PHYSICIAN Please sign FL2     Additional Comment:    _______________________________________________ Gwynne EdingerBibbs, Danh Bayus, LCSW 12/28/2014, 3:12  PM

## 2014-12-29 DIAGNOSIS — K652 Spontaneous bacterial peritonitis: Secondary | ICD-10-CM | POA: Diagnosis present

## 2014-12-29 DIAGNOSIS — K567 Ileus, unspecified: Secondary | ICD-10-CM | POA: Diagnosis present

## 2014-12-29 DIAGNOSIS — K7031 Alcoholic cirrhosis of liver with ascites: Secondary | ICD-10-CM | POA: Diagnosis present

## 2014-12-29 DIAGNOSIS — I959 Hypotension, unspecified: Secondary | ICD-10-CM | POA: Diagnosis present

## 2014-12-29 DIAGNOSIS — R1084 Generalized abdominal pain: Secondary | ICD-10-CM | POA: Diagnosis present

## 2014-12-29 LAB — CBC
HEMATOCRIT: 26.3 % — AB (ref 36.0–46.0)
HEMOGLOBIN: 8.5 g/dL — AB (ref 12.0–15.0)
MCH: 25.9 pg — AB (ref 26.0–34.0)
MCHC: 32.3 g/dL (ref 30.0–36.0)
MCV: 80.2 fL (ref 78.0–100.0)
Platelets: 244 10*3/uL (ref 150–400)
RBC: 3.28 MIL/uL — ABNORMAL LOW (ref 3.87–5.11)
RDW: 19.9 % — AB (ref 11.5–15.5)
WBC: 12.1 10*3/uL — ABNORMAL HIGH (ref 4.0–10.5)

## 2014-12-29 LAB — COMPREHENSIVE METABOLIC PANEL
ALK PHOS: 55 U/L (ref 38–126)
ALT: 9 U/L — ABNORMAL LOW (ref 14–54)
ANION GAP: 12 (ref 5–15)
AST: 18 U/L (ref 15–41)
Albumin: 2.1 g/dL — ABNORMAL LOW (ref 3.5–5.0)
BILIRUBIN TOTAL: 1.5 mg/dL — AB (ref 0.3–1.2)
BUN: 33 mg/dL — ABNORMAL HIGH (ref 6–20)
CALCIUM: 8.4 mg/dL — AB (ref 8.9–10.3)
CO2: 17 mmol/L — ABNORMAL LOW (ref 22–32)
Chloride: 105 mmol/L (ref 101–111)
Creatinine, Ser: 3.68 mg/dL — ABNORMAL HIGH (ref 0.44–1.00)
GFR, EST AFRICAN AMERICAN: 14 mL/min — AB (ref >60)
GFR, EST NON AFRICAN AMERICAN: 12 mL/min — AB (ref >60)
GLUCOSE: 112 mg/dL — AB (ref 65–99)
POTASSIUM: 3.8 mmol/L (ref 3.5–5.1)
Sodium: 134 mmol/L — ABNORMAL LOW (ref 135–145)
TOTAL PROTEIN: 5.5 g/dL — AB (ref 6.5–8.1)

## 2014-12-29 LAB — URINE MICROSCOPIC-ADD ON

## 2014-12-29 LAB — LACTATE DEHYDROGENASE: LDH: 136 U/L (ref 98–192)

## 2014-12-29 LAB — URINALYSIS, ROUTINE W REFLEX MICROSCOPIC
BILIRUBIN URINE: NEGATIVE
Glucose, UA: NEGATIVE mg/dL
KETONES UR: 15 mg/dL — AB
NITRITE: NEGATIVE
Protein, ur: NEGATIVE mg/dL
Specific Gravity, Urine: 1.029 (ref 1.005–1.030)
UROBILINOGEN UA: 0.2 mg/dL (ref 0.0–1.0)
pH: 5 (ref 5.0–8.0)

## 2014-12-29 LAB — PROTIME-INR
INR: 1.45 (ref 0.00–1.49)
PROTHROMBIN TIME: 17.7 s — AB (ref 11.6–15.2)

## 2014-12-29 LAB — PROTEIN / CREATININE RATIO, URINE
Creatinine, Urine: 156.11 mg/dL
Protein Creatinine Ratio: 0.42 mg/mg{Cre} — ABNORMAL HIGH (ref 0.00–0.15)
TOTAL PROTEIN, URINE: 66 mg/dL

## 2014-12-29 LAB — AMYLASE, PERITONEAL FLUID: AMYLASE, PERITONEAL FLUID: 16 U/L

## 2014-12-29 LAB — CREATININE, URINE, RANDOM: Creatinine, Urine: 157.71 mg/dL

## 2014-12-29 LAB — SODIUM, URINE, RANDOM

## 2014-12-29 MED ORDER — METOCLOPRAMIDE HCL 5 MG/ML IJ SOLN
5.0000 mg | Freq: Two times a day (BID) | INTRAMUSCULAR | Status: DC
  Administered 2014-12-29 – 2015-01-01 (×7): 5 mg via INTRAVENOUS
  Filled 2014-12-29 (×4): qty 1
  Filled 2014-12-29: qty 2
  Filled 2014-12-29 (×4): qty 1

## 2014-12-29 MED ORDER — ALBUMIN HUMAN 5 % IV SOLN
INTRAVENOUS | Status: AC
  Filled 2014-12-29: qty 250

## 2014-12-29 MED ORDER — FENTANYL CITRATE (PF) 100 MCG/2ML IJ SOLN
25.0000 ug | INTRAMUSCULAR | Status: DC | PRN
  Administered 2014-12-29 – 2015-01-01 (×13): 25 ug via INTRAVENOUS
  Filled 2014-12-29 (×14): qty 2

## 2014-12-29 MED ORDER — ALBUMIN HUMAN 25 % IV SOLN
25.0000 g | Freq: Two times a day (BID) | INTRAVENOUS | Status: AC
  Administered 2014-12-30 – 2014-12-31 (×3): 25 g via INTRAVENOUS
  Filled 2014-12-29 (×6): qty 100

## 2014-12-29 MED ORDER — ALBUMIN HUMAN 25 % IV SOLN
25.0000 g | Freq: Two times a day (BID) | INTRAVENOUS | Status: DC
  Administered 2014-12-29: 25 g via INTRAVENOUS
  Filled 2014-12-29 (×3): qty 100

## 2014-12-29 NOTE — Progress Notes (Signed)
[facility] Progress Note  Subjective: No specific complaints. Abdomen feels better.  Objective: Vital signs in last 24 hours: Temp:  [97.6 F (36.4 C)-98 F (36.7 C)] 97.9 F (36.6 C) (11/01 0721) Pulse Rate:  [61-68] 68 (11/01 0719) Resp:  [10-20] 20 (11/01 0719) BP: (103-127)/(39-48) 127/48 mmHg (11/01 0719) SpO2:  [97 %-100 %] 97 % (11/01 0719) Weight change:    PE:  No distress  Abdomen soft nontender  Lab Results: Results for orders placed or performed during the hospital encounter of 12/20/14 (from the past 24 hour(s))  Albumin, pleural or peritoneal fluid     Status: None   Collection Time: 12/28/14 11:18 AM  Result Value Ref Range   Albumin, Fluid <1.0 g/dL   Fluid Type-FALB Peritoneal   Lactate dehydrogenase (CSF, pleural or peritoneal fluid)     Status: Abnormal   Collection Time: 12/28/14 11:18 AM  Result Value Ref Range   LD, Fluid 167 (H) 3 - 23 U/L   Fluid Type-FLDH Peritoneal   Protein, pleural or peritoneal fluid     Status: None   Collection Time: 12/28/14 11:18 AM  Result Value Ref Range   Total protein, fluid <3.0 g/dL   Fluid Type-FTP Peritoneal   Body fluid cell count with differential     Status: Abnormal   Collection Time: 12/28/14 11:18 AM  Result Value Ref Range   Fluid Type-FCT Peritoneal    Color, Fluid YELLOW (A) YELLOW   Appearance, Fluid HAZY (A) CLEAR   WBC, Fluid 384 0 - 1000 cu mm   Neutrophil Count, Fluid 60 (H) 0 - 25 %   Lymphs, Fluid 8 %   Monocyte-Macrophage-Serous Fluid 32 (L) 50 - 90 %   Other Cells, Fluid MESOTHELIAL CELLS, VACUOLATED NEUTROPHILS %  Amylase, Peritoneal Fluid     Status: None   Collection Time: 12/28/14 11:18 AM  Result Value Ref Range   Amylase, peritoneal fluid 16 U/L  Gram stain     Status: None   Collection Time: 12/28/14 11:18 AM  Result Value Ref Range   Specimen Description FLUID PERITONEAL    Special Requests NONE    Gram Stain      MODERATE WBC PRESENT,BOTH PMN AND  MONONUCLEAR NO ORGANISMS SEEN    Report Status 12/28/2014 FINAL   CBC     Status: Abnormal   Collection Time: 12/29/14 12:44 AM  Result Value Ref Range   WBC 12.1 (H) 4.0 - 10.5 K/uL   RBC 3.28 (L) 3.87 - 5.11 MIL/uL   Hemoglobin 8.5 (L) 12.0 - 15.0 g/dL   HCT 95.6 (L) 21.3 - 08.6 %   MCV 80.2 78.0 - 100.0 fL   MCH 25.9 (L) 26.0 - 34.0 pg   MCHC 32.3 30.0 - 36.0 g/dL   RDW 57.8 (H) 46.9 - 62.9 %   Platelets 244 150 - 400 K/uL  Comprehensive metabolic panel     Status: Abnormal   Collection Time: 12/29/14  5:50 AM  Result Value Ref Range   Sodium 134 (L) 135 - 145 mmol/L   Potassium 3.8 3.5 - 5.1 mmol/L   Chloride 105 101 - 111 mmol/L   CO2 17 (L) 22 - 32 mmol/L   Glucose, Bld 112 (H) 65 - 99 mg/dL   BUN 33 (H) 6 - 20 mg/dL   Creatinine, Ser 5.28 (H) 0.44 - 1.00 mg/dL   Calcium 8.4 (L) 8.9 - 10.3 mg/dL   Total Protein 5.5 (L) 6.5 - 8.1 g/dL   Albumin 2.1 (  L) 3.5 - 5.0 g/dL   AST 18 15 - 41 U/L   ALT 9 (L) 14 - 54 U/L   Alkaline Phosphatase 55 38 - 126 U/L   Total Bilirubin 1.5 (H) 0.3 - 1.2 mg/dL   GFR calc non Af Amer 12 (L) >60 mL/min   GFR calc Af Amer 14 (L) >60 mL/min   Anion gap 12 5 - 15  Protime-INR     Status: Abnormal   Collection Time: 12/29/14  5:50 AM  Result Value Ref Range   Prothrombin Time 17.7 (H) 11.6 - 15.2 seconds   INR 1.45 0.00 - 1.49  Lactate dehydrogenase     Status: None   Collection Time: 12/29/14  5:50 AM  Result Value Ref Range   LDH 136 98 - 192 U/L    Studies/Results: [place] Paracentesis  12/28/2014  INDICATION: Ascites. EXAM: ULTRASOUND-GUIDED PARACENTESIS COMPARISON:  Previous para MEDICATIONS: 10 cc 1% lidocaine COMPLICATIONS: None immediate TECHNIQUE: Informed written consent was obtained from the patient after a discussion of the risks, benefits and alternatives to treatment. A timeout was performed prior to the initiation of the procedure. Initial ultrasound scanning demonstrates a large amount of ascites within the right lower  abdominal quadrant. The right lower abdomen was prepped and draped in the usual sterile fashion. 1% lidocaine with epinephrine was used for local anesthesia. Under direct ultrasound guidance, a 19 gauge, 7-cm, Yueh catheter was introduced. An ultrasound image was saved for documentation purposed. The paracentesis was performed. The catheter was removed and a dressing was applied. The patient tolerated the procedure well without immediate post procedural complication. FINDINGS: A total of approximately 2.7 liters of yellow fluid was removed. Samples were sent to the laboratory as requested by the clinical team. IMPRESSION: Successful ultrasound-guided paracentesis yielding 2.7 liters of peritoneal fluid. Read by:  Robet LeuPamela A [facility] Electronically Signed   By: Richarda OverlieAdam  Henn M.D.   On: 12/28/2014 13:40      Assessment: Cirrhosis of liver with ascites  Plan:   Continue medical management per primary team. We will sign off. Nothing more specific to add. Call us if needed.    SAM F Aashi Derrington 12/29/2014, 11:00 AM  Pager: [phone] If no answer or after 5 PM call [phone]

## 2014-12-29 NOTE — Progress Notes (Signed)
[facility] TEAM 1 - Stepdown/ICU TEAM Progress Note  Brain HiltsBleeka T Jacko ZOX:[id number] DOB: 1951/01/01 DOA: 12/20/2014 PCP: Lilia ArgueKAPLAN,KRISTEN, PA-C  Admit HPI / Brief Narrative: 64 y.o. WF PMHx  Anxiety, Alcoholic liver cirrhosis, nicotine abuse, COPD,   Presenting with 2 month history of lower chest/abdominal pain-worsening significantly over a few days. Found to be hypotensive with ARF on initial presentation  HPI/Subjective: 11/1 A/O 4, states positive anorexia, negative N/V  Assessment/Plan: Adominal pain / Infectious vs Inflammatory Colitis/Gaseous distention of stomach -Ongoing for 2 months but worse for the 2 days prior to admit  - CT Abdomen shows mild ascending colitis - S/P IR paracentesis; 2.7 L drained - Given abdominal/pelvic CT appearance suggestive of ileus it behooves us to avoid escalation of narcotics. Will decrease slowly patient's narcotics  -Continue clear liquid diet -Continue current antibiotic -Reglan 5 mg BID  Severe Sepsis with hypotension and lactic acidosis -GI source -ascites fluid pending; SBP?  - UA/CXR neg for UTI/PNA  - empiric Zosyn to continue for now   Acute on chronic renal failure(Baseline crt ~1.2)  - likely pre-renal azotemia or ATN in setting of hypotension/sepsis/diuretics use. Over the weekend patient's creatinine continued to climb now close to 4 have consulted to nephrology; watchful waiting vs temporary HD?   Hyponatremia Resolved   Chronic Microcytic Anemia (Baseline Hgb ~11) - hx of GAVE and small esophageal varices (EGD on 08/25/14 at [facility])  - no overt evidence of GI loss  - s/p 1U PRBC this admit   Alcoholic Liver Cirrhoses -last drink reportedly > 5 years ago - follows with [facility] - MELD score of 24  Subacute Hypothyroidism -TSH> 5 -Free T4; elevated therefore not consistent with subacute hypothyroidism which would be low normal to low   Code Status: FULL Family Communication: no family present at time of  exam Disposition Plan: Resolution colitis    Consultants: PCCM Gen Surg GI  Procedure/Significant Events: 10/23 Admit with abdominal pain 10/23 CT ABD > mild diffuse wall thickening along the ascending colon concerning for acute infectious/inflammatory colitis, small to moderate volume ascites within the abdomen and pelvis, hepatic cirrhosis with prominent collateral vessels noted 10/23 ABD US > hepatic cirrhosis, multiple gallstones with positive sonographic Murphy sign, no wall thickening noted, mild cortical thinning of the kidneys bilaterally 10/30 CT abdomen pelvis without contrast;Liver cirrhosis with associated large volume ascites throughout the abdomen and pelvis. - Mildly distended small bowel loops throughout the abdomen. Suspect small bowel ileus.  -Thickening of the walls of the small bowel in the left abdomen and central abdomen, most likely reactive  - Bibasilar consolidations, Rt >>Lt left, w/ adjacent small pleural effusions. Atelectasis vs airspace collapse. If febrile, pneumonia would be a possibility for RLL- Cholelithiasis.- Anasarca 10/31 IR paracentesis; removed 2.7 L yellow fluid   Culture 10/23 blood right arm/hand NGTD 10/24 MRSA by PCR negative 10/27 stool pending   Antibiotics: Zosyn 10/23 >   DVT prophylaxis: Subcutaneous heparin   Devices   LINES / TUBES:      Continuous Infusions: . dextrose 5 % and 0.9 % NaCl with KCl 20 mEq/L 75 mL/hr at 12/29/14 1100    Objective: VITAL SIGNS: Temp: 97.9 F (36.6 C) (11/01 0721) Temp Source: Oral (11/01 0721) BP: 114/47 mmHg (11/01 1110) Pulse Rate: 65 (11/01 1110) SPO2; FIO2:   Intake/Output Summary (Last 24 hours) at 12/29/14 1201 Last data filed at 12/29/14 1100  Gross per 24 hour  Intake    900 ml  Output  0 ml  Net    900 ml     Exam: General: A/O 4, mild abdominal pain, No acute respiratory distress Eyes: Negative headache, eye pain, double vision,negative scleral  hemorrhage ENT: Negative Runny nose, negative ear pain, negative gingival bleeding, Neck:  Negative scars, masses, torticollis, lymphadenopathy, JVD Lungs: diffuse expiratory wheezing, negative crackles Cardiovascular: Regular rate and rhythm without murmur gallop or rub normal S1 and S2 Abdomen: Positive LUQ/LLQ pain, distended (decreased from last week), positive hypoactive bowel sounds, no rebound, no ascites, no appreciable mass Extremities: No significant cyanosis, clubbing, or edema bilateral lower extremities Psychiatric:  Negative depression, negative anxiety, negative fatigue, negative mania  Neurologic:  Cranial nerves II through XII intact, tongue/uvula midline, all extremities muscle strength 5/5, sensation intact throughout, negative dysarthria, negative expressive aphasia, negative receptive aphasia.   Data Reviewed: Basic Metabolic Panel:  Recent Labs Lab 12/23/14 0425  12/25/14 0513 12/26/14 0600 12/27/14 0445 12/28/14 0639 12/29/14 0550  NA 132*  < > 133* 134* 135 136 134*  K 3.7  < > 3.3* 3.9 3.6 4.1 3.8  CL 103  < > 103 101 102 104 105  CO2 19*  < > 22 21* 22 19* 17*  GLUCOSE 83  < > 94 96 89 120* 112*  BUN 28*  < > 27* 27* 27* 32* 33*  CREATININE 2.12*  < > 1.92* 2.04* 2.29* 3.16* 3.68*  CALCIUM 7.7*  < > 8.4* 9.0 9.0 9.3 8.4*  MG 1.9  --   --   --  2.0  --   --   PHOS  --   --   --   --  3.9  --   --   < > = values in this interval not displayed. Liver Function Tests:  Recent Labs Lab 12/25/14 0513 12/26/14 0600 12/27/14 0445 12/28/14 0639 12/29/14 0550  AST ALT 8* 8* 9* 10* 9*  ALKPHOS 56 66 57 61 55  BILITOT 1.9* 1.7* 1.9* 1.8* 1.5*  PROT 5.8* 6.0* 6.2* 6.5 5.5*  ALBUMIN 2.1* 2.1* 2.0* 2.1* 2.1*   No results for input(s): LIPASE, AMYLASE in the last 168 hours. No results for input(s): AMMONIA in the last 168 hours. CBC:  Recent Labs Lab 12/25/14 0513 12/26/14 0600 12/27/14 0445 12/28/14 0639 12/29/14 0044  WBC 11.3*  11.6* 10.0 16.1* 12.1*  NEUTROABS 8.5*  --   --   --   --   HGB 8.2* 8.4* 8.3* 8.2* 8.5*  HCT 25.5* 26.3* 27.0* 26.7* 26.3*  MCV 78.0 80.2 80.1 79.5 80.2  PLT 278 242 254 349 244   Cardiac Enzymes:  Recent Labs Lab 12/25/14 0513  CKTOTAL 15*  CKMB 1.9   BNP (last 3 results)  Recent Labs  12/20/14 1600 12/22/14 0330  BNP 1051.1* 1281.2*    ProBNP (last 3 results) No results for input(s): PROBNP in the last 8760 hours.  CBG: No results for input(s): GLUCAP in the last 168 hours.  Recent Results (from the past 240 hour(s))  Culture, blood (routine x 2)     Status: None   Collection Time: 12/20/14  8:19 PM  Result Value Ref Range Status   Specimen Description BLOOD RIGHT ARM  Final   Special Requests BOTTLES DRAWN AEROBIC AND ANAEROBIC 10CC  Final   Culture NO GROWTH 5 DAYS  Final   Report Status 12/25/2014 FINAL  Final  Culture, blood (routine x 2)     Status: None   Collection Time:  12/20/14  8:30 PM  Result Value Ref Range Status   Specimen Description BLOOD RIGHT HAND  Final   Special Requests BOTTLES DRAWN AEROBIC AND ANAEROBIC 3CC  Final   Culture NO GROWTH 5 DAYS  Final   Report Status 12/25/2014 FINAL  Final  MRSA PCR Screening     Status: Abnormal   Collection Time: 12/21/14  1:33 PM  Result Value Ref Range Status   MRSA by PCR POSITIVE (A) NEGATIVE Final    Comment:        The GeneXpert MRSA Assay (FDA approved for NASAL specimens only), is one component of a comprehensive MRSA colonization surveillance program. It is not intended to diagnose MRSA infection nor to guide or monitor treatment for MRSA infections. RESULT CALLED TO, READ BACK BY AND VERIFIED WITH: T. PENNINGTON RN 15:50 12/21/14 ([email or web address])   Gram stain     Status: None   Collection Time: 12/28/14 11:18 AM  Result Value Ref Range Status   Specimen Description FLUID PERITONEAL  Final   Special Requests NONE  Final   Gram Stain   Final    MODERATE WBC PRESENT,BOTH PMN AND  MONONUCLEAR NO ORGANISMS SEEN    Report Status 12/28/2014 FINAL  Final     Studies:  Recent x-ray studies have been reviewed in detail by the Attending Physician  Scheduled Meds:  Scheduled Meds: . diltiazem  30 mg Oral 4 times per day  . folic acid  1 mg Oral Daily  . metoCLOPramide (REGLAN) injection  5 mg Intravenous Q12H  . midodrine  10 mg Oral TID WC  . pantoprazole (PROTONIX) IV  40 mg Intravenous Q12H  . piperacillin-tazobactam (ZOSYN)  IV  2.25 g Intravenous 3 times per day  . simethicone  80 mg Oral QID    Time spent on care of this patient: 40 mins   Tawnie Ehresman, Roselind Messier , MD  [facility] Office  [phone] Pager [phone]  On-Call/Text Page:      [email or web address]      password [email or web address]  If 7PM-7AM, please contact night-coverage [email or web address] Password [email or web address] 12/29/2014, 12:01 PM   LOS: 9 days   Care during the described time interval was provided by me .  I have reviewed this patient's available data, including medical history, events of note, physical examination, and all test results as part of my evaluation. I have personally reviewed and interpreted all radiology studies.   Carolyne Littles, MD [phone] Pager

## 2014-12-29 NOTE — Progress Notes (Signed)
Physical Therapy Treatment Patient Details Name: Abigail Crawford MRN: [id number] DOB: [birth year] Today's Date: 12/29/2014    History of Present Illness Pt is a 64 y/o F w/ h/o alcoholic liver cirrhosis who presented w/ 2 month h/o abdominal pain.  Source of abdominal pain likely ileus w/ gaseous distention of stomach.  Additional PMH includes ARF on CKD, CHF, COPD, anxiety, asthma, anemia.    PT Comments    Pt much more lethargic this session and not as willing to participate in ambulatory activity.  Educated pt on consequences of not mobilizing and pt verbalized understanding.  Min assist for sit<>stand and ambulation this session.  Pt will benefit from continued skilled PT services to increase functional independence and safety.   Follow Up Recommendations  SNF;Supervision/Assistance - 24 hour     Equipment Recommendations  None recommended by PT    Recommendations for Other Services       Precautions / Restrictions Precautions Precautions: Fall Precaution Comments: generalized weakness requiring rest breaks Restrictions Weight Bearing Restrictions: No    Mobility  Bed Mobility Overal bed mobility: Needs Assistance Bed Mobility: Supine to Sit     Supine to sit: Min guard;HOB elevated     General bed mobility comments: Min guard for pt's safety and increased time 2/2 lethargy.  HOB elevated and pt uses bed rails.  Transfers Overall transfer level: Needs assistance Equipment used: Standard walker Transfers: Sit to/from Stand;Stand Pivot Transfers Sit to Stand: Min assist Stand pivot transfers: Min assist       General transfer comment: Pt is slow to rise and mobilize 2/2 weakness and requires verbal cues for proper hand placement during sit<>stand transfers from chair and BSC as she initially places both hands on RW.  Pt performs sit<>stand from [facility] x3 as pt requires rest breaks from standing during pericare.  Ambulation/Gait Ambulation/Gait assistance: Min  assist Ambulation Distance (Feet): 5 Feet Assistive device: Standard walker Gait Pattern/deviations: Shuffle;Decreased stride length;Antalgic;Trunk flexed   Gait velocity interpretation: <1.8 ft/sec, indicative of risk for recurrent falls General Gait Details: Trunk flexed 2/2 abdominal pain and pt w/ very dec gait speed and shuffling.  Min assist managing walker as pt continues to repeat, "I need to sit down, my knees are going to buckle".  Provided max encouragement but pt was insistent on sitting down for fear of falling 2/2 generalized weakness.   Stairs            Wheelchair Mobility    Modified Rankin (Stroke Patients Only)       Balance Overall balance assessment: Needs assistance Sitting-balance support: Bilateral upper extremity supported;Feet supported Sitting balance-Leahy Scale: Fair Sitting balance - Comments: close min guard 2/2 pt's lethargy   Standing balance support: Bilateral upper extremity supported;During functional activity Standing balance-Leahy Scale: Poor Standing balance comment: Relies on RW and min assist for support                    Cognition Arousal/Alertness: Lethargic Behavior During Therapy: Anxious Overall Cognitive Status: Within Functional Limits for tasks assessed                      Exercises General Exercises - Lower Extremity Ankle Circles/Pumps: AROM;Both;10 reps;Seated Long Arc Quad: AROM;Both;10 reps;Seated Hip Flexion/Marching: AROM;Both;10 reps;Seated Other Exercises Other Exercises: Encouraged pt to ambulate w/ nursing staff later in the day after resting.    General Comments General comments (skin integrity, edema, etc.): Pt much more lethargic this session and not as  willing to participate in ambulatory activity.  Educated pt on consequences of not mobilizing and pt verbalized understanding.  BP 122/ 53 sitting on BSC, otherwise VSS.      Pertinent Vitals/Pain Pain Assessment: 0-10 Pain Score: 3   Pain Location: abdomen Pain Descriptors / Indicators: Cramping Pain Intervention(s): Limited activity within patient's tolerance;Monitored during session;Repositioned    Home Living                      Prior Function            PT Goals (current goals can now be found in the care plan section) Acute Rehab PT Goals Patient Stated Goal: "I don't want to lay in bed, I want to get stronger" PT Goal Formulation: With patient Time For Goal Achievement: 01/16/15 Potential to Achieve Goals: Good Progress towards PT goals: Progressing toward goals (very modestly)    Frequency  Min 2X/week    PT Plan Current plan remains appropriate    Co-evaluation             End of Session Equipment Utilized During Treatment: Gait belt;Oxygen Activity Tolerance: Patient limited by fatigue;Patient limited by pain;Patient limited by lethargy Patient left: in chair;with call bell/phone within reach     Time: 0947-1024 PT Time Calculation (min) (ACUTE ONLY): 37 min  Charges:  $Therapeutic Exercise: 8-22 mins $Therapeutic Activity: 8-22 mins                    G Codes:      Michail JewelsAshley Parr PT, DPT [phone] Pager: [phone] 12/29/2014, 10:42 AM

## 2014-12-29 NOTE — Consult Note (Signed)
[name] Admit Date: 12/20/2014 12/29/2014 Abigail Crawford Requesting Physician:  Joseph Art MD  Reason for Consult:  AKI, Cirrhosis HPI:  64 year old female seen at the request of Dr. Joseph Art for the evaluation of an elevated serum creatinine. Patient's pertinent past medical history includes  alcoholic cirrhosis with large volume ascites.  Patient has also renal insufficiency with an unclear time course. When she was admitted many days ago her creatinine was in the mid 2s, values before then or more than a year old but also demonstrates some mild renal insufficiency.   She has had a progressive increase in serum creatinine, especially over the past 2 days. Yesterday, on 12/28/14 she underwent large-volume paracentesis, removing 2.7 L. No identified contrast exposure, nonsteroidal use during hospitalization, other common nephrotoxins. No recent urine studies, at the time of her presentation her urine sodium was less than 10. CT abdomen done on 12/27/14 commented on unremarkable kidneys with no evidence of obstruction.  She is not currently on diuretics nor has she been. Results from her paracentesis on 10/31 demonstrated 384 white blood cells, 60% neutrophils, 32% monocytes / macrophages.  She is currently receiving Zosyn.  Home medications do include/list ibuprofen, furosemide.  Urine output has not been quantified in the past 3 days.   CREATININE, SER (mg/dL)  Date Value  16/11/9602 3.68*  12/28/2014 3.16*  12/27/2014 2.29*  12/26/2014 2.04*  12/25/2014 1.92*  12/24/2014 1.97*  12/23/2014 2.12*  12/22/2014 2.48*  12/21/2014 2.45*  12/20/2014 2.30*  ] I/Os: I/O last 3 completed shifts: In: 1085.8 [I.V.:985.8; IV Piggyback:100] Out: -    ROS Balance of 12 systems is negative w/ exceptions as above  PMH  Past Medical History  Diagnosis Date  . CHF (congestive heart failure) (HCC)   . Cirrhosis (HCC)   . COPD (chronic obstructive pulmonary disease) (HCC)   . Chronic kidney  disease     "from cirrhosis of the liver"  . Anxiety   . Arthritis   . GERD (gastroesophageal reflux disease)   . Asthma   . Migraines 02/28/11    "I have bad migraines"  . Ascitic fluid    PSH  Past Surgical History  Procedure Laterality Date  . Cataract extraction w/ intraocular lens  implant, bilateral  2011  . Appendectomy  1965   FH History reviewed. No pertinent family history. SH  reports that she has been smoking Cigarettes.  She has a 45 pack-year smoking history. She has never used smokeless tobacco. She reports that she drinks about 21.0 oz of alcohol per week. She reports that she does not use illicit drugs. Allergies  Allergies  Allergen Reactions  . Tylenol [Acetaminophen] Other (See Comments)    Can not take with tramadol-not allergic  . Aspirin Rash  . Cortisone Rash and Other (See Comments)    Red spots.  . Tea Rash   Home medications Prior to Admission medications   Medication Sig Start Date End Date Taking? Authorizing Provider  albuterol (PROVENTIL HFA;VENTOLIN HFA) 108 (90 BASE) MCG/ACT inhaler Inhale 2 puffs into the lungs every 4 (four) hours. Scheduled   Yes Historical Provider, MD  ALPRAZolam (XANAX) 0.5 MG tablet Take 0.5 mg by mouth 4 (four) times daily as needed for anxiety or sleep.    Yes Historical Provider, MD  esomeprazole (NEXIUM) 40 MG capsule Take 40 mg by mouth daily before breakfast.     Yes Historical Provider, MD  folic acid (FOLVITE) 1 MG tablet Take 1 mg by mouth daily.  Yes Historical Provider, MD  furosemide (LASIX) 20 MG tablet Take 20 mg by mouth daily.     Yes Historical Provider, MD  ibuprofen (ADVIL,MOTRIN) 200 MG tablet Take 200 mg by mouth every 4 (four) hours as needed for moderate pain.    Yes Historical Provider, MD  pantoprazole (PROTONIX) 40 MG tablet Take 40 mg by mouth daily.   Yes Historical Provider, MD  polyethylene glycol (MIRALAX / GLYCOLAX) packet Take 17 g by mouth at bedtime.    Yes Historical Provider, MD   propranolol (INDERAL) 10 MG tablet Take 10 mg by mouth daily.     Yes Historical Provider, MD  spironolactone (ALDACTONE) 25 MG tablet Take 25 mg by mouth daily.     Yes Historical Provider, MD  thiamine (VITAMIN B-1) 100 MG tablet Take 100 mg by mouth daily.   Yes Historical Provider, MD  traMADol (ULTRAM) 50 MG tablet Take 50 mg by mouth every 6 (six) hours. Maximum dose= 8 tablets per day   Yes Historical Provider, MD  traZODone (DESYREL) 50 MG tablet Take 50-100 mg by mouth at bedtime.   Yes Historical Provider, MD    Current Medications Scheduled Meds: . diltiazem  30 mg Oral 4 times per day  . folic acid  1 mg Oral Daily  . metoCLOPramide (REGLAN) injection  5 mg Intravenous Q12H  . midodrine  10 mg Oral TID WC  . pantoprazole (PROTONIX) IV  40 mg Intravenous Q12H  . piperacillin-tazobactam (ZOSYN)  IV  2.25 g Intravenous 3 times per day  . simethicone  80 mg Oral QID   Continuous Infusions: . dextrose 5 % and 0.9 % NaCl with KCl 20 mEq/L 75 mL/hr at 12/29/14 1400   PRN Meds:.ALPRAZolam, fentaNYL (SUBLIMAZE) injection, levalbuterol  CBC  Recent Labs Lab 12/25/14 0513  12/27/14 0445 12/28/14 0639 12/29/14 0044  WBC 11.3*  < > 10.0 16.1* 12.1*  NEUTROABS 8.5*  --   --   --   --   HGB 8.2*  < > 8.3* 8.2* 8.5*  HCT 25.5*  < > 27.0* 26.7* 26.3*  MCV 78.0  < > 80.1 79.5 80.2  PLT 278  < > 254 349 244  < > = values in this interval not displayed. Basic Metabolic Panel  Recent Labs Lab 12/23/14 0425 12/24/14 0334 12/25/14 0513 12/26/14 0600 12/27/14 0445 12/28/14 0639 12/29/14 0550  NA 132* 136 133* 134* 135 136 134*  K 3.7 3.5 3.3* 3.9 3.6 4.1 3.8  CL 103 105 103 101 102 104 105  CO2 19* 22 22 21* 22 19* 17*  GLUCOSE 83 91 94 96 89 120* 112*  BUN 28* 25* 27* 27* 27* 32* 33*  CREATININE 2.12* 1.97* 1.92* 2.04* 2.29* 3.16* 3.68*  CALCIUM 7.7* 8.4* 8.4* 9.0 9.0 9.3 8.4*  PHOS  --   --   --   --  3.9  --   --     Physical Exam  Blood pressure 114/47, pulse 65,  temperature 98.3 F (36.8 C), temperature source Oral, resp. rate 18, height  (1.6 m), weight 53.8 kg (118 lb 9.7 oz), SpO2 99 %. GEN: Chronically ill-appearing, cachectic ENT: Temporal wasting present. Poor dentition EYES: Sunken eyes, EOMI CV: Regular rate and rhythm, no rub, normal S1/S2 PULM: Clear bilaterally ABD: Mildly tender to palpation throughout, no rebound or guarding, distended but soft SKIN: No rashes or lesions EXT: No pitting edema   Assessment/Plan 42F with AoCKD (or just AKI) in setting of Cirrhosis  with Ascites s/p 2.7L paracentesis 10/31, ? SBP on Zosyn 1. AKI 1. Suspect HRS 1. Check U Na, UP/C, UA 2. CT A/P 10/30 w/o obstruction 3. Stop IVFs, give 25gm BID Albumin x2d 2. Daily weights, Daily Renal Panel, Strict I/Os, Avoid nephrotoxins (NSAIDs, judicious IV Contrast) 3. Pt unlikely to benefit from RRT IF indicated 4. No diuretics 5. Baseline SCr might be in 2s, havent had timely data 2. Cirrhosis with Ascites 3. FTT 4. COPD 5. Chronic Abdominal Pain  Sabra Heckyan Dallis Czaja MD [phone] 12/29/2014, 2:24 PM

## 2014-12-30 DIAGNOSIS — K652 Spontaneous bacterial peritonitis: Secondary | ICD-10-CM

## 2014-12-30 DIAGNOSIS — K567 Ileus, unspecified: Secondary | ICD-10-CM

## 2014-12-30 LAB — CBC
HEMATOCRIT: 26 % — AB (ref 36.0–46.0)
HEMOGLOBIN: 8.3 g/dL — AB (ref 12.0–15.0)
MCH: 25.6 pg — ABNORMAL LOW (ref 26.0–34.0)
MCHC: 31.9 g/dL (ref 30.0–36.0)
MCV: 80.2 fL (ref 78.0–100.0)
Platelets: 216 10*3/uL (ref 150–400)
RBC: 3.24 MIL/uL — AB (ref 3.87–5.11)
RDW: 20.6 % — ABNORMAL HIGH (ref 11.5–15.5)
WBC: 13.3 10*3/uL — ABNORMAL HIGH (ref 4.0–10.5)

## 2014-12-30 LAB — BASIC METABOLIC PANEL
ANION GAP: 15 (ref 5–15)
BUN: 38 mg/dL — AB (ref 6–20)
CALCIUM: 9 mg/dL (ref 8.9–10.3)
CO2: 16 mmol/L — AB (ref 22–32)
CREATININE: 4.58 mg/dL — AB (ref 0.44–1.00)
Chloride: 106 mmol/L (ref 101–111)
GFR calc Af Amer: 11 mL/min — ABNORMAL LOW (ref >60)
GFR, EST NON AFRICAN AMERICAN: 9 mL/min — AB (ref >60)
GLUCOSE: 90 mg/dL (ref 65–99)
Potassium: 4.1 mmol/L (ref 3.5–5.1)
Sodium: 137 mmol/L (ref 135–145)

## 2014-12-30 MED ORDER — SODIUM CHLORIDE 0.9 % IV SOLN
50.0000 ug/h | INTRAVENOUS | Status: DC
  Administered 2014-12-30 – 2015-01-01 (×5): 50 ug/h via INTRAVENOUS
  Filled 2014-12-30 (×10): qty 1

## 2014-12-30 MED ORDER — DILTIAZEM HCL 30 MG PO TABS
30.0000 mg | ORAL_TABLET | Freq: Three times a day (TID) | ORAL | Status: DC
  Administered 2014-12-30 – 2015-01-01 (×6): 30 mg via ORAL
  Filled 2014-12-30 (×8): qty 1

## 2014-12-30 NOTE — Progress Notes (Signed)
[facility] TEAM 1 - Stepdown/ICU TEAM PROGRESS NOTE  Brain HiltsBleeka T Hayward ZOX:[id number] DOB: 18-Oct-1950 DOA: 12/20/2014 PCP: Lilia ArgueKAPLAN,KRISTEN, PA-C  Admit HPI / Brief Narrative: 64 y.o. female with h/o Alcoholic liver cirrhosis presenting with 2 month history of lower chest/abdominal pain-worsening significantly over a few days. Found to be hypotensive with ARF on initial presentation  Significant Events: 10/23 Admit with abdominal pain 10/23 CT ABD > mild diffuse wall thickening along the ascending colon concerning for acute infectious/inflammatory colitis, small to moderate volume ascites within the abdomen and pelvis, hepatic cirrhosis with prominent collateral vessels noted 10/23 ABD US > hepatic cirrhosis, multiple gallstones with positive sonographic Murphy sign, no wall thickening noted, mild cortical thinning of the kidneys bilaterally  HPI/Subjective: The patient is more alert today than I have seen her in many days.  She complains of bilateral upper abdominal quadrant pain.  She denies shortness of breath chest pain nausea or vomiting.  She is in fact hungry.  I have had a very frank discussion with the patient and her daughter at the bedside.  I have explained that we would be treating her with maximal medical therapy for her hepatorenal syndrome but that if her renal function does not respond to this treatment and begin to improve that she will likely die from renal failure in short course.  She and her daughter voiced understanding of this but are hopeful that her renal function will improve.  Assessment/Plan:  Adominal pain  Ongoing for 2 months but worse for the 2 days prior to admit - initial CT Abdomen noted ?of mild ascending colitis - IR attempted paracentesis at admission but US showed no drainable ascites - NG was ultimately placed and decompressed the patient's stomach with temporary symptomatic improvement - discontinued NG 10/28 - repeat CT of the abdomen revealed no evidence  of obstruction but did suggest a probable small bowel ileus as well as large volume ascites - second attempt at paracentesis was made on 10/31 and this time was successful in draining 2.7 L   Severe Sepsis with hypotension and lactic acidosis ascites studies concerning for SBP with WBC count of 384 - UA/CXR neg for UTI/PNA - empiric Zosyn to continue for now   ARF on CKD - hepatorenal syndrome Baseline crt ~1.2 - Nephrology consulted and studies suggestive of hepatorenal syndrome - crt continues to climb - patient now on midodrine and octreotide - she is not a candidate for hemodialysis per nephrology - she has been informed that if her renal function continues to worsen she will likely die of acute renal failure in a matter of days  Hyponatremia Improved with IVF - stable   Hypokalemia  Keep at ~4.0 - stable   Transient atrial fibrillation NSR at time of exam - continue to monitor on telemetry - with her liver disease she is not a candidate for anticoagulation   Chronic Microcytic Anemia Baseline Hgb ~11 - hx of GAVE and small esophageal varices (EGD on 08/25/14 at [facility]) - no overt evidence of GI loss - s/p 1U PRBC this admit - continue to follow in serial fashion  Alcoholic Liver Cirrhoses last drink reportedly > 5 years ago - follows with [facility] - MELD score of 24 at admit  MRSA Screen +  Code Status: FULL Family Communication: Spoke with daughter and patient at bedside at length Disposition Plan: SDU  Consultants: PCCM Gen Surg GI  Antibiotics: Zosyn 10/23 >   DVT prophylaxis: SQ heparin   Objective: Blood pressure  119/42, pulse 60, temperature 98.1 F (36.7 C), temperature source Oral, resp. rate 16, height  (1.6 m), weight 53.8 kg (118 lb 9.7 oz), SpO2 99 %.  Intake/Output Summary (Last 24 hours) at 12/30/14 1359 Last data filed at 12/30/14 1300  Gross per 24 hour  Intake  592.5 ml  Output    125 ml  Net  467.5 ml   Exam: General: No  acute respiratory distress - alert  Lungs: Clear to auscultation bilaterally without crackles or wheeze  Cardiovascular: Regular rate and rhythm without murmur   Abdomen: protuberant, no guarding, mildly tender diffusely,  no rebound, bowel sounds present but high pitched, no appreciable focal mass Extremities: no significant cyanosis, clubbing, or edema bilateral lower extremities  Data Reviewed: Basic Metabolic Panel:  Recent Labs Lab 12/26/14 0600 12/27/14 0445 12/28/14 0639 12/29/14 0550 12/30/14 1005  NA 134* 135 136 134* 137  K 3.9 3.6 4.1 3.8 4.1  CL 101 102 104 105 106  CO2 21* 22 19* 17* 16*  GLUCOSE 96 89 120* 112* 90  BUN 27* 27* 32* 33* 38*  CREATININE 2.04* 2.29* 3.16* 3.68* 4.58*  CALCIUM 9.0 9.0 9.3 8.4* 9.0  MG  --  2.0  --   --   --   PHOS  --  3.9  --   --   --     CBC:  Recent Labs Lab 12/25/14 0513 12/26/14 0600 12/27/14 0445 12/28/14 0639 12/29/14 0044 12/30/14 1005  WBC 11.3* 11.6* 10.0 16.1* 12.1* 13.3*  NEUTROABS 8.5*  --   --   --   --   --   HGB 8.2* 8.4* 8.3* 8.2* 8.5* 8.3*  HCT 25.5* 26.3* 27.0* 26.7* 26.3* 26.0*  MCV 78.0 80.2 80.1 79.5 80.2 80.2  PLT 278 242 254 349 244 216    Liver Function Tests:  Recent Labs Lab 12/25/14 0513 12/26/14 0600 12/27/14 0445 12/28/14 0639 12/29/14 0550  AST ALT 8* 8* 9* 10* 9*  ALKPHOS 56 66 57 61 55  BILITOT 1.9* 1.7* 1.9* 1.8* 1.5*  PROT 5.8* 6.0* 6.2* 6.5 5.5*  ALBUMIN 2.1* 2.1* 2.0* 2.1* 2.1*    Recent Results (from the past 240 hour(s))  Culture, blood (routine x 2)     Status: None   Collection Time: 12/20/14  8:19 PM  Result Value Ref Range Status   Specimen Description BLOOD RIGHT ARM  Final   Special Requests BOTTLES DRAWN AEROBIC AND ANAEROBIC 10CC  Final   Culture NO GROWTH 5 DAYS  Final   Report Status 12/25/2014 FINAL  Final  Culture, blood (routine x 2)     Status: None   Collection Time: 12/20/14  8:30 PM  Result Value Ref Range Status   Specimen  Description BLOOD RIGHT HAND  Final   Special Requests BOTTLES DRAWN AEROBIC AND ANAEROBIC 3CC  Final   Culture NO GROWTH 5 DAYS  Final   Report Status 12/25/2014 FINAL  Final  MRSA PCR Screening     Status: Abnormal   Collection Time: 12/21/14  1:33 PM  Result Value Ref Range Status   MRSA by PCR POSITIVE (A) NEGATIVE Final    Comment:        The GeneXpert MRSA Assay (FDA approved for NASAL specimens only), is one component of a comprehensive MRSA colonization surveillance program. It is not intended to diagnose MRSA infection nor to guide or monitor treatment for MRSA infections. RESULT CALLED TO, READ BACK BY AND  VERIFIED WITH: T. PENNINGTON RN 15:50 12/21/14 ([email or web address])   Culture, body fluid-bottle     Status: None (Preliminary result)   Collection Time: 12/28/14 11:18 AM  Result Value Ref Range Status   Specimen Description FLUID PERITONEAL  Final   Special Requests NONE  Final   Culture NO GROWTH 2 DAYS  Final   Report Status PENDING  Incomplete  Gram stain     Status: None   Collection Time: 12/28/14 11:18 AM  Result Value Ref Range Status   Specimen Description FLUID PERITONEAL  Final   Special Requests NONE  Final   Gram Stain   Final    MODERATE WBC PRESENT,BOTH PMN AND MONONUCLEAR NO ORGANISMS SEEN    Report Status 12/28/2014 FINAL  Final     Studies:   Recent x-ray studies have been reviewed in detail by the Attending Physician  Scheduled Meds:  Scheduled Meds: . albumin human  25 g Intravenous BID  . diltiazem  30 mg Oral 4 times per day  . folic acid  1 mg Oral Daily  . metoCLOPramide (REGLAN) injection  5 mg Intravenous Q12H  . midodrine  10 mg Oral TID WC  . pantoprazole (PROTONIX) IV  40 mg Intravenous Q12H  . piperacillin-tazobactam (ZOSYN)  IV  2.25 g Intravenous 3 times per day  . simethicone  80 mg Oral QID    Time spent on care of this patient: 35 mins   Marlin Jarrard T , MD   [facility] Office  [phone] Pager - Text  Page per Loretha Stapler as per below:  On-Call/Text Page:      [email or web address]      password [email or web address]  If 7PM-7AM, please contact night-coverage [email or web address] Password [email or web address] 12/30/2014, 1:59 PM   LOS: 10 days

## 2014-12-30 NOTE — Progress Notes (Signed)
ANTIBIOTIC CONSULT NOTE - FOLLOW UP  Pharmacy Consult for Zosyn Indication: rule out sepsis and SBP  Allergies  Allergen Reactions  . Tylenol [Acetaminophen] Other (See Comments)    Can not take with tramadol-not allergic  . Aspirin Rash  . Cortisone Rash and Other (See Comments)    Red spots.  . Tea Rash   Patient Measurements: Height:  (160 cm) Weight: 118 lb 9.7 oz (53.8 kg) IBW/kg (Calculated) : 52.4 Vital Signs: Temp: 97.8 F (36.6 C) (11/02 0615) Temp Source: Oral (11/02 0615) BP: 108/61 mmHg (11/02 0731) Pulse Rate: 64 (11/02 0731) Intake/Output from previous day: 11/01 0701 - 11/02 0700 In: 770 [P.O.:120; I.V.:600; IV Piggyback:50] Out: 75 [Urine:75] Intake/Output from this shift: Total I/O In: 60 [P.O.:60] Out: -   Labs:  Recent Labs  12/28/14 0639 12/29/14 0044 12/29/14 0550 12/29/14 1801 12/30/14 1005  WBC 16.1* 12.1*  --   --  13.3*  HGB 8.2* 8.5*  --   --  8.3*  PLT 349 244  --   --  216  LABCREA  --   --   --  156.11  157.71  --   CREATININE 3.16*  --  3.68*  --   --    Estimated Creatinine Clearance: 12.6 mL/min (by C-G formula based on Cr of 3.68). No results for input(s): VANCOTROUGH, VANCOPEAK, VANCORANDOM, GENTTROUGH, GENTPEAK, GENTRANDOM, TOBRATROUGH, TOBRAPEAK, TOBRARND, AMIKACINPEAK, AMIKACINTROU, AMIKACIN in the last 72 hours.   Microbiology: Recent Results (from the past 720 hour(s))  Culture, blood (routine x 2)     Status: None   Collection Time: 12/20/14  8:19 PM  Result Value Ref Range Status   Specimen Description BLOOD RIGHT ARM  Final   Special Requests BOTTLES DRAWN AEROBIC AND ANAEROBIC 10CC  Final   Culture NO GROWTH 5 DAYS  Final   Report Status 12/25/2014 FINAL  Final  Culture, blood (routine x 2)     Status: None   Collection Time: 12/20/14  8:30 PM  Result Value Ref Range Status   Specimen Description BLOOD RIGHT HAND  Final   Special Requests BOTTLES DRAWN AEROBIC AND ANAEROBIC 3CC  Final   Culture NO GROWTH  5 DAYS  Final   Report Status 12/25/2014 FINAL  Final  MRSA PCR Screening     Status: Abnormal   Collection Time: 12/21/14  1:33 PM  Result Value Ref Range Status   MRSA by PCR POSITIVE (A) NEGATIVE Final    Comment:        The GeneXpert MRSA Assay (FDA approved for NASAL specimens only), is one component of a comprehensive MRSA colonization surveillance program. It is not intended to diagnose MRSA infection nor to guide or monitor treatment for MRSA infections. RESULT CALLED TO, READ BACK BY AND VERIFIED WITH: T. PENNINGTON RN 15:50 12/21/14 ([email or web address])   Culture, body fluid-bottle     Status: None (Preliminary result)   Collection Time: 12/28/14 11:18 AM  Result Value Ref Range Status   Specimen Description FLUID PERITONEAL  Final   Special Requests NONE  Final   Culture NO GROWTH 1 DAY  Final   Report Status PENDING  Incomplete  Gram stain     Status: None   Collection Time: 12/28/14 11:18 AM  Result Value Ref Range Status   Specimen Description FLUID PERITONEAL  Final   Special Requests NONE  Final   Gram Stain   Final    MODERATE WBC PRESENT,BOTH PMN AND MONONUCLEAR NO ORGANISMS SEEN    Report  Status 12/28/2014 FINAL  Final    Anti-infectives    Start     Dose/Rate Route Frequency Ordered Stop   12/27/14 1400  piperacillin-tazobactam (ZOSYN) IVPB 2.25 g     2.25 g 100 mL/hr over 30 Minutes Intravenous 3 times per day 12/27/14 1237     12/24/14 1300  piperacillin-tazobactam (ZOSYN) IVPB 3.375 g  Status:  Discontinued     3.375 g 12.5 mL/hr over 240 Minutes Intravenous 3 times per day 12/24/14 1133 12/27/14 1237   12/22/14 0845  piperacillin-tazobactam (ZOSYN) IVPB 2.25 g  Status:  Discontinued     2.25 g 100 mL/hr over 30 Minutes Intravenous 4 times per day 12/22/14 0838 12/24/14 1133   12/20/14 2300  piperacillin-tazobactam (ZOSYN) IVPB 3.375 g  Status:  Discontinued     3.375 g 12.5 mL/hr over 240 Minutes Intravenous Every 8 hours 12/20/14 2139 12/22/14 0838    12/20/14 2200  vancomycin (VANCOCIN) 500 mg in sodium chloride 0.9 % 100 mL IVPB  Status:  Discontinued     500 mg 100 mL/hr over 60 Minutes Intravenous Every 24 hours 12/20/14 2139 12/20/14 2216      Assessment: 64 year old female with hx cirrhosis admitted with 2 month history of abdominal pain and concern for SBP on Zosyn.   Day #10 of abx for r/o sepsis with abdominal pain poss SBP. Afebrile, WBC  Up to 13.3. Suspected hepatorenal syndrome, SCr up to 3.36, CrCl~12 (baseline 1.3). No new BMET today. CT revealed mild diffuse wall thickening along ascending colon (acute inf vs inflam colitis).  Vanc 10/23 x1 Zosyn 10/23 >>  10/23 BCx2: ngf 10/24 MRSA pcr: positive 10/31 Peritoneal fluid Gstain>>ngtd  Goal of Therapy:  Clinical resolution of infection  Plan:  Zosyn 2.25 gm IV q8h Monitor clinical picture, renal function F/U C&S, abx deescalation - soon? D#10  Babs BertinHaley Senon Nixon, PharmD Clinical Pharmacist Pager [phone] 12/30/2014 10:43 AM

## 2014-12-30 NOTE — Progress Notes (Addendum)
Admit: 12/20/2014 LOS: 10  76F with AoCKD (or just AKI) in setting of Cirrhosis with Ascites s/p 2.7L paracentesis 10/31, ? SBP on Zosyn  Subjective:  No new events overnight UA without significant proteinuria, hematuria, pyuria Urine protein to creatinine ratio negative Urine sodium less than 10   11/01 0701 - 11/02 0700 In: 770 [P.O.:120; I.V.:600; IV Piggyback:50] Out: 75 [Urine:75]  Filed Weights   12/20/14 2137 12/20/14 2324  Weight: 53.071 kg (117 lb) 53.8 kg (118 lb 9.7 oz)    Scheduled Meds: . albumin human  25 g Intravenous BID  . diltiazem  30 mg Oral 4 times per day  . folic acid  1 mg Oral Daily  . metoCLOPramide (REGLAN) injection  5 mg Intravenous Q12H  . midodrine  10 mg Oral TID WC  . pantoprazole (PROTONIX) IV  40 mg Intravenous Q12H  . piperacillin-tazobactam (ZOSYN)  IV  2.25 g Intravenous 3 times per day  . simethicone  80 mg Oral QID   Continuous Infusions:  PRN Meds:.ALPRAZolam, fentaNYL (SUBLIMAZE) injection, levalbuterol  Current Labs: reviewed  11/1: UNa <10, UA bland w/o pyuria or hematuria, UP/C < 0.5  Physical Exam:  Blood pressure 108/61, pulse 64, temperature 97.8 F (36.6 C), temperature source Oral, resp. rate 21, height 5\' 3"  (1.6 m), weight 53.8 kg (118 lb 9.7 oz), SpO2 100 %. GEN: Chronically ill-appearing, cachectic ENT: Temporal wasting present. Poor dentition EYES: Sunken eyes, EOMI CV: Regular rate and rhythm, no rub, normal S1/S2 PULM: Clear bilaterally ABD: Mildly tender to palpation throughout, no rebound or guarding, distended but soft SKIN: No rashes or lesions EXT: No pitting edema  A/P 1. AKI Suspect HRS 1. Urine studies consistent with HRS 2. CT A/P 10/30 w/o obstruction 3. Currently receiving 2 days of albumin 1. Daily weights, Daily Renal Panel, Strict I/Os, Avoid nephrotoxins (NSAIDs, judicious IV Contrast) 2. Patient is not a candidate for short or long-term renal replacement therapy given her tremendous  comorbidities and nature of renal failure 3. We'll continue to try to support her through this, currently on midodrine and will start octreotide 4. No diuretics 5. Baseline SCr might be in 2s, havent had timely data 6. Consider palliative care evaluation for overall poor prognosis 2. Cirrhosis with Ascites 3. FTT 4. COPD 5. Chronic Abdominal Pain  Sabra Heckyan Meiya Wisler MD 12/30/2014, 10:53 AM   Recent Labs Lab 12/27/14 0445 12/28/14 0639 12/29/14 0550  NA 135 136 134*  K 3.6 4.1 3.8  CL 102 104 105  CO2 22 19* 17*  GLUCOSE 89 120* 112*  BUN 27* 32* 33*  CREATININE 2.29* 3.16* 3.68*  CALCIUM 9.0 9.3 8.4*  PHOS 3.9  --   --     Recent Labs Lab 12/25/14 0513  12/28/14 0639 12/29/14 0044 12/30/14 1005  WBC 11.3*  < > 16.1* 12.1* 13.3*  NEUTROABS 8.5*  --   --   --   --   HGB 8.2*  < > 8.2* 8.5* 8.3*  HCT 25.5*  < > 26.7* 26.3* 26.0*  MCV 78.0  < > 79.5 80.2 80.2  PLT 278  < > 349 244 216  < > = values in this interval not displayed.

## 2014-12-31 LAB — RENAL FUNCTION PANEL
ALBUMIN: 2.7 g/dL — AB (ref 3.5–5.0)
ANION GAP: 13 (ref 5–15)
BUN: 41 mg/dL — ABNORMAL HIGH (ref 6–20)
CHLORIDE: 107 mmol/L (ref 101–111)
CO2: 15 mmol/L — ABNORMAL LOW (ref 22–32)
Calcium: 8.5 mg/dL — ABNORMAL LOW (ref 8.9–10.3)
Creatinine, Ser: 4.92 mg/dL — ABNORMAL HIGH (ref 0.44–1.00)
GFR calc Af Amer: 10 mL/min — ABNORMAL LOW (ref >60)
GFR calc non Af Amer: 8 mL/min — ABNORMAL LOW (ref >60)
GLUCOSE: 114 mg/dL — AB (ref 65–99)
PHOSPHORUS: 6.3 mg/dL — AB (ref 2.5–4.6)
POTASSIUM: 4.2 mmol/L (ref 3.5–5.1)
Sodium: 135 mmol/L (ref 135–145)

## 2014-12-31 LAB — CBC
HEMATOCRIT: 25.1 % — AB (ref 36.0–46.0)
HEMOGLOBIN: 7.9 g/dL — AB (ref 12.0–15.0)
MCH: 25 pg — ABNORMAL LOW (ref 26.0–34.0)
MCHC: 31.5 g/dL (ref 30.0–36.0)
MCV: 79.4 fL (ref 78.0–100.0)
Platelets: 218 10*3/uL (ref 150–400)
RBC: 3.16 MIL/uL — ABNORMAL LOW (ref 3.87–5.11)
RDW: 20.8 % — AB (ref 11.5–15.5)
WBC: 12.2 10*3/uL — ABNORMAL HIGH (ref 4.0–10.5)

## 2014-12-31 NOTE — Progress Notes (Signed)
Admit: 12/20/2014 LOS: 11  47F with AoCKD (or just AKI) in setting of Cirrhosis with Ascites s/p 2.7L paracentesis 10/31, ? SBP on Zosyn.  Pt has HRS type 1  Subjective:  Renal function continues to worsen  11/02 0701 - 11/03 0700 In: 1312.5 [P.O.:570; I.V.:442.5; IV Piggyback:300] Out: 175 [Urine:175]  Filed Weights   12/20/14 2137 12/20/14 2324  Weight: 53.071 kg (117 lb) 53.8 kg (118 lb 9.7 oz)    Scheduled Meds: . diltiazem  30 mg Oral 3 times per day  . folic acid  1 mg Oral Daily  . metoCLOPramide (REGLAN) injection  5 mg Intravenous Q12H  . midodrine  10 mg Oral TID WC  . pantoprazole (PROTONIX) IV  40 mg Intravenous Q12H  . piperacillin-tazobactam (ZOSYN)  IV  2.25 g Intravenous 3 times per day  . simethicone  80 mg Oral QID   Continuous Infusions: . octreotide  (SANDOSTATIN)    IV infusion 50 mcg/hr (12/31/14 0928)   PRN Meds:.ALPRAZolam, fentaNYL (SUBLIMAZE) injection, levalbuterol  Current Labs: reviewed  11/1: UNa <10, UA bland w/o pyuria or hematuria, UP/C < 0.5  Physical Exam:  Blood pressure 130/47, pulse 61, temperature 97.2 F (36.2 C), temperature source Axillary, resp. rate 12, height 5\' 3"  (1.6 m), weight 53.8 kg (118 lb 9.7 oz), SpO2 96 %. GEN: Chronically ill-appearing, cachectic ENT: Temporal wasting present. Poor dentition EYES: Sunken eyes, EOMI CV: Regular rate and rhythm, no rub, normal S1/S2 PULM: Clear bilaterally ABD: Mildly tender to palpation throughout, no rebound or guarding, distended but soft SKIN: No rashes or lesions EXT: No pitting edema  A/P 1. 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 Urine studies consistent with HRS 2. CT A/P 10/30 w/o obstruction 3. Currently receiving 2 days of albumin 1. Daily weights, Daily Renal Panel, Strict I/Os, Avoid nephrotoxins (NSAIDs, judicious IV Contrast) 2. Patient is not a candidate for short or long-term renal replacement therapy given her tremendous comorbidities and nature of renal failure 3. We'll  continue to try to support her through this, cont octreotide and midodrine 4. No diuretics 5. Baseline SCr might be in 2s, havent had timely data 6. Consider palliative care evaluation for overall poor prognosis 2. Cirrhosis with Ascites and ? Recent SBP 3. FTT 4. COPD 5. Chronic Abdominal Pain  Sabra Heckyan Chameka Mcmullen MD 12/31/2014, 10:52 AM   Recent Labs Lab 12/27/14 0445  12/29/14 0550 12/30/14 1005 12/31/14 0400  NA 135  < > 134* 137 135  K 3.6  < > 3.8 4.1 4.2  CL 102  < > 105 106 107  CO2 22  < > 17* 16* 15*  GLUCOSE 89  < > 112* 90 114*  BUN 27*  < > 33* 38* 41*  CREATININE 2.29*  < > 3.68* 4.58* 4.92*  CALCIUM 9.0  < > 8.4* 9.0 8.5*  PHOS 3.9  --   --   --  6.3*  < > = values in this interval not displayed.  Recent Labs Lab 12/25/14 0513  12/29/14 0044 12/30/14 1005 12/31/14 0400  WBC 11.3*  < > 12.1* 13.3* 12.2*  NEUTROABS 8.5*  --   --   --   --   HGB 8.2*  < > 8.5* 8.3* 7.9*  HCT 25.5*  < > 26.3* 26.0* 25.1*  MCV 78.0  < > 80.2 80.2 79.4  PLT 278  < > 244 216 218  < > = values in this interval not displayed.

## 2014-12-31 NOTE — Progress Notes (Signed)
[facility] TEAM 1 - Stepdown/ICU TEAM Progress Note  Abigail Crawford:[id number] DOB: [birth year] DOA: 12/20/2014 PCP: [name]  Admit HPI / Brief Narrative: 64 y.o. WF PMHx  Anxiety, Alcoholic liver cirrhosis, nicotine abuse, COPD,   Presenting with 2 month history of lower chest/abdominal pain-worsening significantly over a few days. Found to be hypotensive with ARF on initial presentation  HPI/Subjective: 11/3 lethargic but arousable, A/O 4, states mild abdominal pain  Assessment/Plan: Adominal pain / Infectious vs Inflammatory Colitis/Gaseous distention of stomach -Ongoing for 2 months but worse for the 2 days prior to admit  - CT Abdomen shows mild ascending colitis - S/P IR paracentesis; 2.7 L drained - Patient tolerating regular diet -Continue current antibiotic -Reglan 5 mg BID  Severe Sepsis with hypotension and lactic acidosis -GI source -ascites fluid pending; SBP?  - UA/CXR neg for UTI/PNA  - empiric Zosyn to continue for now   Acute on chronic renal failure(Baseline crt ~1.2)  - Hepatorenal syndrome.  -Patient evaluated by nephrology and found not to be a suitable candidate for HD -Continue octreotide and midodrine per nephrology  Hyponatremia Resolved   Chronic Microcytic Anemia (Baseline Hgb ~11) - hx of GAVE and small esophageal varices (EGD on 08/25/14 at [facility])  - no overt evidence of GI loss  - s/p 1U PRBC this admit   Alcoholic Liver Cirrhoses -last drink reportedly > 5 years ago - follows with [facility] - MELD score of 24  Subacute Hypothyroidism -TSH> 5 -Free T4; elevated therefore not consistent with subacute hypothyroidism which would be low normal to low  Goals of care -Palliative care consult placed;Patient with worsening hepatorenal syndrome prognosis poor; discussed with family short-term vs long-term goals of care   Code Status: FULL Family Communication: no family present at time of exam Disposition Plan:  Resolution colitis    Consultants: PCCM Dr.Matthew Tsuei, (CCS)  Dr.Salem F Bing Matter GI Dr.Ryan B Sanford (nephrology)  Procedure/Significant Events: 10/23 Admit with abdominal pain 10/23 CT ABD > mild diffuse wall thickening along the ascending colon concerning for acute infectious/inflammatory colitis, small to moderate volume ascites within the abdomen and pelvis, hepatic cirrhosis with prominent collateral vessels noted 10/23 ABD [place] > hepatic cirrhosis, multiple gallstones with positive sonographic Murphy sign, no wall thickening noted, mild cortical thinning of the kidneys bilaterally 10/30 CT abdomen pelvis without contrast;Liver cirrhosis with associated large volume ascites throughout the abdomen and pelvis. - Mildly distended small bowel loops throughout the abdomen. Suspect small bowel ileus.  -Thickening of the walls of the small bowel in the left abdomen and central abdomen, most likely reactive  - Bibasilar consolidations, Rt >>Lt left, w/ adjacent small pleural effusions. Atelectasis vs airspace collapse. If febrile, pneumonia would be a possibility for RLL- Cholelithiasis.- Anasarca 10/31 IR paracentesis; removed 2.7 L yellow fluid   Culture 10/23 blood right arm/hand NGTD 10/24 MRSA by PCR negative 10/27 stool pending   Antibiotics: Zosyn 10/23 >   DVT prophylaxis: Subcutaneous heparin   Devices   LINES / TUBES:      Continuous Infusions: . octreotide  (SANDOSTATIN)    IV infusion 50 mcg/hr (12/30/14 2140)    Objective: VITAL SIGNS: Temp: 97.2 F (36.2 C) (11/03 0730) Temp Source: Axillary (11/03 0730) BP: 130/47 mmHg (11/03 0412) Pulse Rate: 61 (11/03 0412) SPO2; FIO2:   Intake/Output Summary (Last 24 hours) at 12/31/14 0906 Last data filed at 12/31/14 8119  Gross per 24 hour  Intake 1012.5 ml  Output    150  ml  Net  862.5 ml     Exam: General: lethargic but arousable, A/O 4 mild abdominal pain, No acute respiratory  distress Eyes: Negative headache, eye pain, double vision,negative scleral hemorrhage ENT: Negative Runny nose, negative ear pain, negative gingival bleeding, Neck:  Negative scars, masses, torticollis, lymphadenopathy, JVD Lungs: diffuse expiratory wheezing, negative crackles Cardiovascular: Regular rate and rhythm without murmur gallop or rub normal S1 and S2 Abdomen: Positive LUQ/LLQ pain, distended (decreased from last week), positive hypoactive bowel sounds, no rebound, no ascites, no appreciable mass Extremities: No significant cyanosis, clubbing, or edema bilateral lower extremities Psychiatric:  Negative depression, negative anxiety, negative fatigue, negative mania  Neurologic:  Cranial nerves II through XII intact, tongue/uvula midline, all extremities muscle strength 5/5, sensation intact throughout, negative dysarthria, negative expressive aphasia, negative receptive aphasia.   Data Reviewed: Basic Metabolic Panel:  Recent Labs Lab 12/27/14 0445 12/28/14 0639 12/29/14 0550 12/30/14 1005 12/31/14 0400  NA 135 136 134* 137 135  K 3.6 4.1 3.8 4.1 4.2  CL 102 104 105 106 107  CO2 22 19* 17* 16* 15*  GLUCOSE 89 120* 112* 90 114*  BUN 27* 32* 33* 38* 41*  CREATININE 2.29* 3.16* 3.68* 4.58* 4.92*  CALCIUM 9.0 9.3 8.4* 9.0 8.5*  MG 2.0  --   --   --   --   PHOS 3.9  --   --   --  6.3*   Liver Function Tests:  Recent Labs Lab 12/25/14 0513 12/26/14 0600 12/27/14 0445 12/28/14 0639 12/29/14 0550 12/31/14 0400  AST 17 21 19 20 18   --   ALT 8* 8* 9* 10* 9*  --   ALKPHOS 56 66 57 61 55  --   BILITOT 1.9* 1.7* 1.9* 1.8* 1.5*  --   PROT 5.8* 6.0* 6.2* 6.5 5.5*  --   ALBUMIN 2.1* 2.1* 2.0* 2.1* 2.1* 2.7*   No results for input(s): LIPASE, AMYLASE in the last 168 hours. No results for input(s): AMMONIA in the last 168 hours. CBC:  Recent Labs Lab 12/25/14 0513  12/27/14 0445 12/28/14 0639 12/29/14 0044 12/30/14 1005 12/31/14 0400  WBC 11.3*  < > 10.0 16.1*  12.1* 13.3* 12.2*  NEUTROABS 8.5*  --   --   --   --   --   --   HGB 8.2*  < > 8.3* 8.2* 8.5* 8.3* 7.9*  HCT 25.5*  < > 27.0* 26.7* 26.3* 26.0* 25.1*  MCV 78.0  < > 80.1 79.5 80.2 80.2 79.4  PLT 278  < > 254 349 244 216 218  < > = values in this interval not displayed. Cardiac Enzymes:  Recent Labs Lab 12/25/14 0513  CKTOTAL 15*  CKMB 1.9   BNP (last 3 results)  Recent Labs  12/20/14 1600 12/22/14 0330  BNP 1051.1* 1281.2*    ProBNP (last 3 results) No results for input(s): PROBNP in the last 8760 hours.  CBG: No results for input(s): GLUCAP in the last 168 hours.  Recent Results (from the past 240 hour(s))  MRSA PCR Screening     Status: Abnormal   Collection Time: 12/21/14  1:33 PM  Result Value Ref Range Status   MRSA by PCR POSITIVE (A) NEGATIVE Final    Comment:        The GeneXpert MRSA Assay (FDA approved for NASAL specimens only), is one component of a comprehensive MRSA colonization surveillance program. It is not intended to diagnose MRSA infection nor to guide or monitor treatment for MRSA  infections. RESULT CALLED TO, READ BACK BY AND VERIFIED WITH: T. PENNINGTON RN 15:50 12/21/14 ([email or web address])   Culture, body fluid-bottle     Status: None (Preliminary result)   Collection Time: 12/28/14 11:18 AM  Result Value Ref Range Status   Specimen Description FLUID PERITONEAL  Final   Special Requests NONE  Final   Culture NO GROWTH 2 DAYS  Final   Report Status PENDING  Incomplete  Gram stain     Status: None   Collection Time: 12/28/14 11:18 AM  Result Value Ref Range Status   Specimen Description FLUID PERITONEAL  Final   Special Requests NONE  Final   Gram Stain   Final    MODERATE WBC PRESENT,BOTH PMN AND MONONUCLEAR NO ORGANISMS SEEN    Report Status 12/28/2014 FINAL  Final     Studies:  Recent x-ray studies have been reviewed in detail by the Attending Physician  Scheduled Meds:  Scheduled Meds: . diltiazem  30 mg Oral 3 times per day  .  folic acid  1 mg Oral Daily  . metoCLOPramide (REGLAN) injection  5 mg Intravenous Q12H  . midodrine  10 mg Oral TID WC  . pantoprazole (PROTONIX) IV  40 mg Intravenous Q12H  . piperacillin-tazobactam (ZOSYN)  IV  2.25 g Intravenous 3 times per day  . simethicone  80 mg Oral QID    Time spent on care of this patient: 40 mins   WOODS, Roselind Messier , MD  [facility] Office  [phone] Pager [phone]  On-Call/Text Page:      [email or web address]      password [email or web address]  If 7PM-7AM, please contact night-coverage [email or web address] Password [email or web address] 12/31/2014, 9:06 AM   LOS: 11 days   Care during the described time interval was provided by me .  I have reviewed this patient's available data, including medical history, events of note, physical examination, and all test results as part of my evaluation. I have personally reviewed and interpreted all radiology studies.   Carolyne Littles, MD [phone] Pager

## 2014-12-31 NOTE — Progress Notes (Signed)
Physical Therapy Treatment Patient Details Name: Abigail Crawford MRN: [id number] DOB: November 30, 1950 Today's Date: 12/31/2014    History of Present Illness Pt is a 64 y/o F w/ h/o alcoholic liver cirrhosis who presented w/ 2 month h/o abdominal pain.  Source of abdominal pain likely ileus w/ gaseous distention of stomach.  Additional PMH includes ARF on CKD, CHF, COPD, anxiety, asthma, anemia.    PT Comments    Patient unable to progress with mobility due to abdominal pain and continued renal failure (RN reports unable to do HD or CRRT.)  Pt continues to be motivated to mobilize however despite moaning throughout session due to abdominal pain.  Will continue PT as tolerated.  Follow Up Recommendations  SNF;Supervision/Assistance - 24 hour     Equipment Recommendations  None recommended by PT    Recommendations for Other Services       Precautions / Restrictions Precautions Precautions: Fall Precaution Comments: generalized weakness requiring rest breaks    Mobility  Bed Mobility Overal bed mobility: Needs Assistance       Supine to sit: HOB elevated;Mod assist     General bed mobility comments: assist for legs to edge of bed, scooting hips and lifting trunk  Transfers Overall transfer level: Needs assistance Equipment used: Standard walker Transfers: Sit to/from Stand Sit to Stand: Mod assist Stand pivot transfers: Min assist       General transfer comment: needed lifting and lowering assist for safety with sit<>stand.  pivotal steps limited with SW and fatigue so brought chair around for pt to sit  Ambulation/Gait Ambulation/Gait assistance: Min assist Ambulation Distance (Feet): 4 Feet Assistive device: Standard walker Gait Pattern/deviations: Step-to pattern;Decreased stride length;Shuffle;Trunk flexed     General Gait Details: assist to manage walker and pt with very slow short shuffling steps, but determined to try despite moaning throughout due to abdominal  pain   Stairs            Wheelchair Mobility    Modified Rankin (Stroke Patients Only)       Balance     Sitting balance-Leahy Scale: Fair Sitting balance - Comments: supervision due to weakness, slightly impulsive   Standing balance support: Bilateral upper extremity supported Standing balance-Leahy Scale: Poor Standing balance comment: needs UE support and minguard for safety with balance                    Cognition Arousal/Alertness: Awake/alert Behavior During Therapy: Anxious Overall Cognitive Status: Impaired/Different from baseline Area of Impairment: Problem solving             Problem Solving: Slow processing General Comments: seems not to have understanding of why her stomach hurts.  Stated feels her breakfast made it worse, but hopeful that eating couple more meals might help    Exercises      General Comments        Pertinent Vitals/Pain Pain Assessment: Faces Faces Pain Scale: Hurts whole lot Pain Location: abdomen Pain Descriptors / Indicators: Cramping Pain Intervention(s): Limited activity within patient's tolerance;Monitored during session;Repositioned    Home Living                      Prior Function            PT Goals (current goals can now be found in the care plan section) Progress towards PT goals: Not progressing toward goals - comment (weakness, renal failure, etc)    Frequency  Min 2X/week    PT  Plan Current plan remains appropriate    Co-evaluation             End of Session Equipment Utilized During Treatment: Gait belt;Oxygen Activity Tolerance: Patient limited by fatigue;Patient limited by pain Patient left: in chair;with call bell/phone within reach     Time: 1015-1040 PT Time Calculation (min) (ACUTE ONLY): 25 min  Charges:  $Gait Training: 8-22 mins $Therapeutic Activity: 8-22 mins                    G Codes:      Abigail Crawford,CYNDI Jan 05, 2015, 10:51 AM  Abigail Crawford,  PT [phone] January 05, 2015

## 2014-12-31 NOTE — Progress Notes (Signed)
Chaplain Note:  Request   Nurse observed pts daughter and granddaughter as tearful and felt they may benefit from support.  When I met with them the daughter was assisting pt. With eating lunch.  Pt. Was alert and pleasant. When asked how she was doing she initially said, " I'm doing ok" but then corrected herself and said, I'm lying I'm not doing well, but I;m trying to do the best I can."    As I noted that she was attempting to eat I offered to return when we could talk further and she agreed that she would be open to that.   As I walked out with the granddaughter she told me that their Minister had visited earlier in the day and it seemed to help her grandmother.  I will follow up as I promised but also aware that she has some pastoral support already which may well be her primary support.  Sue Lush - [phone]

## 2015-01-01 DIAGNOSIS — R1084 Generalized abdominal pain: Secondary | ICD-10-CM

## 2015-01-01 DIAGNOSIS — Z515 Encounter for palliative care: Secondary | ICD-10-CM

## 2015-01-01 DIAGNOSIS — Z66 Do not resuscitate: Secondary | ICD-10-CM

## 2015-01-01 LAB — BASIC METABOLIC PANEL
ANION GAP: 12 (ref 5–15)
BUN: 44 mg/dL — ABNORMAL HIGH (ref 6–20)
CHLORIDE: 108 mmol/L (ref 101–111)
CO2: 15 mmol/L — ABNORMAL LOW (ref 22–32)
CREATININE: 5.61 mg/dL — AB (ref 0.44–1.00)
Calcium: 8.3 mg/dL — ABNORMAL LOW (ref 8.9–10.3)
GFR calc non Af Amer: 7 mL/min — ABNORMAL LOW (ref >60)
GFR, EST AFRICAN AMERICAN: 8 mL/min — AB (ref >60)
Glucose, Bld: 128 mg/dL — ABNORMAL HIGH (ref 65–99)
POTASSIUM: 3.9 mmol/L (ref 3.5–5.1)
SODIUM: 135 mmol/L (ref 135–145)

## 2015-01-01 LAB — CBC
HCT: 26.1 % — ABNORMAL LOW (ref 36.0–46.0)
Hemoglobin: 8.2 g/dL — ABNORMAL LOW (ref 12.0–15.0)
MCH: 25.2 pg — AB (ref 26.0–34.0)
MCHC: 31.4 g/dL (ref 30.0–36.0)
MCV: 80.1 fL (ref 78.0–100.0)
Platelets: 228 10*3/uL (ref 150–400)
RBC: 3.26 MIL/uL — AB (ref 3.87–5.11)
RDW: 21.3 % — ABNORMAL HIGH (ref 11.5–15.5)
WBC: 13.9 10*3/uL — AB (ref 4.0–10.5)

## 2015-01-01 MED ORDER — MORPHINE SULFATE (CONCENTRATE) 10 MG/0.5ML PO SOLN: 5.0000 mg | ORAL | Status: AC | PRN

## 2015-01-01 MED ORDER — MORPHINE SULFATE (CONCENTRATE) 10 MG/0.5ML PO SOLN: 5.0000 mg | ORAL | Status: DC | PRN

## 2015-01-01 MED ORDER — LORAZEPAM 1 MG PO TABS
1.0000 mg | ORAL_TABLET | ORAL | Status: DC | PRN
  Administered 2015-01-01: 1 mg via ORAL
  Filled 2015-01-01: qty 1

## 2015-01-01 MED ORDER — DILTIAZEM HCL 30 MG PO TABS: 30.0000 mg | ORAL_TABLET | Freq: Three times a day (TID) | ORAL | Status: AC

## 2015-01-01 MED ORDER — FENTANYL CITRATE (PF) 100 MCG/2ML IJ SOLN: 25.0000 ug | INTRAMUSCULAR | Status: DC | PRN

## 2015-01-01 MED ORDER — LORAZEPAM 1 MG PO TABS: 1.0000 mg | ORAL_TABLET | ORAL | Status: AC | PRN

## 2015-01-01 MED ORDER — SENNA 8.6 MG PO TABS: 1.0000 | ORAL_TABLET | Freq: Every day | ORAL | Status: DC | PRN

## 2015-01-01 MED ORDER — SENNA 8.6 MG PO TABS: 1.0000 | ORAL_TABLET | Freq: Every day | ORAL | Status: AC | PRN

## 2015-01-01 MED ORDER — FENTANYL CITRATE (PF) 100 MCG/2ML IJ SOLN
25.0000 ug | INTRAMUSCULAR | Status: DC | PRN
  Filled 2015-01-01: qty 2

## 2015-01-01 MED ORDER — MIDODRINE HCL 10 MG PO TABS: 10.0000 mg | ORAL_TABLET | Freq: Three times a day (TID) | ORAL | Status: AC

## 2015-01-01 MED ORDER — LEVALBUTEROL HCL 0.63 MG/3ML IN NEBU: 0.6300 mg | INHALATION_SOLUTION | RESPIRATORY_TRACT | Status: AC | PRN

## 2015-01-01 NOTE — Progress Notes (Signed)
OT Cancellation Note/Discharge  Patient Details Name: Abigail Crawford MRN: [id number] DOB: 11-23-1950   Cancelled Treatment:    Reason Eval/Treat Not Completed: Other (comment) Pt being D/C to hospice services. OT signing off. [facility],HILLARY  Bow Buntyn, OTR/L  [phone] 01/01/2015 01/01/2015, 3:54 PM

## 2015-01-01 NOTE — Progress Notes (Addendum)
CM spoke with pt's daughter, daughter desiring  home hopice for mom with mom's consent. CM awaiting palliative meeting(12:30) with daughter/son. Daughter stated would like for mom to go to her home and received home hospice. CM to f/u with disposition needs. Gae Gallopngela Janely Gullickson RN,BSN,CM [phone]

## 2015-01-01 NOTE — Progress Notes (Signed)
Admit: 12/20/2014 LOS: 37  64F with AoCKD (or just AKI) in setting of Cirrhosis with Ascites s/p 2.7L paracentesis 10/31, ? SBP on Zosyn.  Pt has HRS type 1  Subjective:  Met with patient and daughter this morning. Discussed poor prognosis with progressive renal failure in the context of hepatorenal syndrome. Discussed that there are few therapeutic options which have not been used. She has continued to worsen. Discussed that she has not a candidate for short or long-term hemodialysis because of the nature of her kidney disease. I strongly recommended transitioning to palliative care and the daughter was very open and ready for this as well as the patient. There is a meeting this afternoon with palliative care.  11/03 0701 - 11/04 0700 In: 400 [I.V.:300; IV Piggyback:100] Out: 100 [Urine:100]  Filed Weights   12/20/14 2137 12/20/14 2324  Weight: 53.071 kg (117 lb) 53.8 kg (118 lb 9.7 oz)    Scheduled Meds: . diltiazem  30 mg Oral 3 times per day  . folic acid  1 mg Oral Daily  . metoCLOPramide (REGLAN) injection  5 mg Intravenous Q12H  . midodrine  10 mg Oral TID WC  . pantoprazole (PROTONIX) IV  40 mg Intravenous Q12H  . piperacillin-tazobactam (ZOSYN)  IV  2.25 g Intravenous 3 times per day  . simethicone  80 mg Oral QID   Continuous Infusions: . octreotide  (SANDOSTATIN)    IV infusion 50 mcg/hr (01/01/15 0649)   PRN Meds:.ALPRAZolam, fentaNYL (SUBLIMAZE) injection, levalbuterol  Current Labs: reviewed  11/1: UNa <10, UA bland w/o pyuria or hematuria, UP/C < 0.5  Physical Exam:  Blood pressure 122/58, pulse 75, temperature 98.1 F (36.7 C), temperature source Oral, resp. rate 15, height _0  (1.6 m), weight 53.8 kg (118 lb 9.7 oz), SpO2 98 %. GEN: Chronically ill-appearing, cachectic ENT: Temporal wasting present. Poor dentition EYES: Sunken eyes, EOMI CV: Regular rate and rhythm, no rub, normal S1/S2 PULM: Clear bilaterally ABD: Mildly tender to palpation throughout, no  rebound or guarding, distended but soft SKIN: No rashes or lesions EXT: No pitting edema  A/P 1. 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 Urine studies consistent with HRS 2. CT A/P 10/30 w/o obstruction 3. Completed 2 days of albumin 4. Currently on octreotide and Midrin 5. Patient is not a candidate for short or long-term renal replacement therapy given her tremendous comorbidities and nature of renal failure 6. No diuretics 7. Baseline SCr might be in 2s, havent had time 8. Agree with transfer to palliative care/hospice 2. Cirrhosis with Ascites and ? Recent SBP 3. FTT 4. COPD 5. Chronic Abdominal Pain   Will sign off for now.  Please call with any questions or concerns.    Pearson Grippe MD 01/01/2015, 11:15 AM   Recent Labs Lab 12/27/14 0445  12/29/14 0550 12/30/14 1005 12/31/14 0400  NA 135  < > 134* 137 135  K 3.6  < > 3.8 4.1 4.2  CL 102  < > 105 106 107  CO2 22  < > 17* 16* 15*  GLUCOSE 89  < > 112* 90 114*  BUN 27*  < > 33* 38* 41*  CREATININE 2.29*  < > 3.68* 4.58* 4.92*  CALCIUM 9.0  < > 8.4* 9.0 8.5*  PHOS 3.9  --   --   --  6.3*  < > = values in this interval not displayed.  Recent Labs Lab 12/30/14 1005 12/31/14 0400 01/01/15 1050  WBC 13.3* 12.2* 13.9*  HGB 8.3* 7.9*  8.2*  HCT 26.0* 25.1* 26.1*  MCV 80.2 79.4 80.1  PLT 216 218 228

## 2015-01-01 NOTE — Progress Notes (Signed)
   01/01/15 1900  Clinical Encounter Type  Visited With Patient and family together;Health care provider  Visit Type Initial;Psychological support;Spiritual support;Social support;Critical Care;Patient actively dying  Referral From Care management  Consult/Referral To Hospice;Faith community  Spiritual Encounters  Spiritual Needs Prayer;Emotional  Stress Factors  Patient Stress Factors Health changes;Major life changes  Family Stress Factors Family relationships;Health changes;Lack of caregivers;Lack of knowledge;Loss of control;Major life changes  Advance Directives (For Healthcare)  Does patient have an advance directive? No   Chaplain visited with son and mother and gave emotional and spiritual support.

## 2015-01-01 NOTE — Clinical Social Work Note (Signed)
CSW spoke with the pt's daughter Bonita QuinLinda. Bonita QuinLinda informed the CSW that she will be meeting with palliative care team today at noon. Bonita QuinLinda reported that she will take the pt home. At this time CSW will sign off.    Clinical Social Worker  Leighla Chestnutt, MSW, LCSW [phone]

## 2015-01-01 NOTE — Discharge Summary (Addendum)
Physician Discharge Summary  ABBAGAIL SCAFF RUE:[id number] DOB: 04/29/1950 DOA: 12/20/2014  PCP: [name]  Admit date: 12/20/2014 Discharge date: 01/01/2015  Time spent:  Recommendations for Outpatient Follow-up: Adominal pain / Infectious vs Inflammatory Colitis/Gaseous distention of stomach - CT Abdomen shows mild ascending colitis - S/P IR paracentesis; 2.7 L drained - Patient tolerating regular diet -Resolved  Acute on chronic respiratory failure -Patient requires 2 L O2 at a minimum to maintain SPO2> 93% -Titrate O2 to maintain SPO2> 93%  Severe Sepsis with hypotension and lactic acidosis -GI source -ascites fluid pending; SBP? - UA/CXR neg for UTI/PNA  - empiric Zosyn started now discontinued -Home hospice  Acute on chronic renal failure(Baseline crt ~1.2)  - Hepatorenal syndrome.  -Patient evaluated by nephrology and found not to be a suitable candidate for HD -Continue octreotide and midodrine per nephrology -Home hospice  Hyponatremia Resolved   Chronic Microcytic Anemia (Baseline Hgb ~11) - hx of GAVE and small esophageal varices (EGD on 08/25/14 at [facility])  - no overt evidence of GI loss  - s/p 1U PRBC this admit   Alcoholic Liver Cirrhoses -last drink reportedly > 5 years ago - follows with [facility] - MELD score of 24  Subacute Hypothyroidism -TSH> 5 -Free T4; elevated therefore not consistent with subacute hypothyroidism which would be low normal to low  Goals of care -Palliative care consulted;Patient with worsening hepatorenal syndrome prognosis poor. Patient and family decided on Home Hospice     Discharge Diagnoses:  Principal Problem:   Abdominal pain Active Problems:   Hyponatremia   Liver cirrhosis (HCC)   Hypotension   AKI (acute kidney injury) (HCC)   Encounter for central line placement   Acute abdominal pain   Infectious colitis   Severe sepsis (HCC)   Acute on chronic renal failure  (HCC)   Microcytic anemia   Alcoholic cirrhosis of liver without ascites (HCC)   Other specified hypothyroidism   Abdominal pain, generalized   Alcoholic cirrhosis of liver with ascites (HCC)   Arterial hypotension   Ileus (HCC)   SBP (spontaneous bacterial peritonitis) (HCC)   Palliative care encounter   DNR (do not resuscitate)   Discharge Condition: stable   Diet recommendation: Regular  Filed Weights   12/20/14 2137 12/20/14 2324  Weight: 53.071 kg (117 lb) 53.8 kg (118 lb 9.7 oz)    History of present illness:  64 y.o. WF PMHx Anxiety, Alcoholic liver cirrhosis, nicotine abuse, COPD,    Presenting with 2 month history of lower chest/abdominal pain-worsening significantly over a few days. Found to be hypotensive with ARF on initial presentation During his hospitalization patient had abdominal pain which was diagnosed as infectious colitis. Patient was placed on appropriate antibiotics and abdominal pain began to resolve. Patient now able to eat a regular diet without postprandial pain. Unfortunately the sepsis caused by patient's infectious colitis + her Cirrhotic Liver developed into  Hepatorenal syndrome. After failure of aggressive treatment for Hepatorenal syndrome, the patient discussed options with her family and decided on Home Hospice.     Consultants: PCCM Dr.Matthew Tsuei, (CCS)  Dr.Salem F Bing [facility] Dr.Ryan B Sanford (nephrology)  Procedure/Significant Events: 10/23 Admit with abdominal pain 10/23 CT ABD > mild diffuse wall thickening along the ascending colon concerning for acute infectious/inflammatory colitis, small to moderate volume ascites within the abdomen and pelvis, hepatic cirrhosis with prominent collateral vessels noted 10/23 ABD [place] > hepatic cirrhosis, multiple gallstones with positive sonographic Murphy sign, no wall thickening noted,  mild cortical thinning of the kidneys bilaterally 10/30 CT abdomen pelvis without contrast;Liver cirrhosis  with associated large volume ascites throughout the abdomen and pelvis. - Mildly distended small bowel loops throughout the abdomen. Suspect small bowel ileus.  -Thickening of the walls of the small bowel in the left abdomen and central abdomen, most likely reactive  - Bibasilar consolidations, Rt >>Lt left, w/ adjacent small pleural effusions. Atelectasis vs airspace collapse. If febrile, pneumonia would be a possibility for RLL- Cholelithiasis.- Anasarca 10/31 IR paracentesis; removed 2.7 L yellow fluid   Culture 10/23 blood right arm/hand NGTD 10/24 MRSA by PCR negative 10/27 stool pending   Antibiotics: Zosyn 10/23 >>> 11/4    Discharge Exam: Filed Vitals:   01/01/15 0400 01/01/15 0410 01/01/15 0700 01/01/15 0744  BP:  131/51  122/58  Pulse:  64  75  Temp: 97.6 F (36.4 C) 97.6 F (36.4 C) 98.1 F (36.7 C)   TempSrc: Oral Oral Oral   Resp:  9  15  Height:      Weight:      SpO2:  100%  98%    General: A/O 4,, NAD, negative acute respiratory stress. Cardiovascular: Regular rate and rhythm without murmur gallop or rub normal S1 and S2 Respiratory: Clear to auscultation bilateral  Discharge Instructions     Medication List    STOP taking these medications        ALPRAZolam 0.5 MG tablet  Commonly known as:  XANAX     furosemide 20 MG tablet  Commonly known as:  LASIX     pantoprazole 40 MG tablet  Commonly known as:  PROTONIX     polyethylene glycol packet  Commonly known as:  MIRALAX / GLYCOLAX     propranolol 10 MG tablet  Commonly known as:  INDERAL     spironolactone 25 MG tablet  Commonly known as:  ALDACTONE     thiamine 100 MG tablet  Commonly known as:  VITAMIN B-1     traMADol 50 MG tablet  Commonly known as:  ULTRAM      TAKE these medications        albuterol 108 (90 BASE) MCG/ACT inhaler  Commonly known as:  PROVENTIL HFA;VENTOLIN HFA  Inhale 2 puffs into the lungs every 4 (four) hours. Scheduled     diltiazem 30 MG tablet   Commonly known as:  CARDIZEM  Take 1 tablet (30 mg total) by mouth 3 (three) times daily.     esomeprazole 40 MG capsule  Commonly known as:  NEXIUM  Take 40 mg by mouth daily before breakfast.     folic acid 1 MG tablet  Commonly known as:  FOLVITE  Take 1 mg by mouth daily.     ibuprofen 200 MG tablet  Commonly known as:  ADVIL,MOTRIN  Take 200 mg by mouth every 4 (four) hours as needed for moderate pain.     levalbuterol 0.63 MG/3ML nebulizer solution  Commonly known as:  XOPENEX  Take 3 mLs (0.63 mg total) by nebulization every 3 (three) hours as needed for wheezing.     LORazepam 1 MG tablet  Commonly known as:  ATIVAN  Take 1 tablet (1 mg total) by mouth every 4 (four) hours as needed for anxiety or sleep.     midodrine 10 MG tablet  Commonly known as:  PROAMATINE  Take 1 tablet (10 mg total) by mouth 3 (three) times daily with meals.     morphine CONCENTRATE 10 MG/0.5ML Soln concentrated solution  Take 0.25 mLs (5 mg total) by mouth every hour as needed for moderate pain or shortness of breath.     senna 8.6 MG Tabs tablet  Commonly known as:  SENOKOT  Take 1 tablet (8.6 mg total) by mouth daily as needed for mild constipation.     traZODone 50 MG tablet  Commonly known as:  DESYREL  Take 50-100 mg by mouth at bedtime.       Allergies  Allergen Reactions  . Tylenol [Acetaminophen] Other (See Comments)    Can not take with tramadol-not allergic  . Aspirin Rash  . Cortisone Rash and Other (See Comments)    Red spots.  . Tea Rash      The results of significant diagnostics from this hospitalization (including imaging, microbiology, ancillary and laboratory) are listed below for reference.    Significant Diagnostic Studies: Ct Abdomen Pelvis Wo Contrast  12/27/2014  CLINICAL DATA:  Diffuse abdominal pain for the last 3 days. History of CHF, cirrhosis, COPD, chronic kidney disease, appendectomy. EXAM: CT ABDOMEN AND PELVIS WITHOUT CONTRAST TECHNIQUE:  Multidetector CT imaging of the abdomen and pelvis was performed following the standard protocol without IV contrast. COMPARISON:  None. FINDINGS: Liver is cirrhotic with nodular peripheral contours. No focal mass or lesion seen within the liver although characterization is limited without intravascular contrast. Spleen, pancreas, and adrenal glands are unremarkable. Kidneys are unremarkable without stone or hydronephrosis. Gallbladder is moderately distended, containing numerous small layering stones. No evidence of a significant bile duct dilatation. There is a large amount of ascites throughout the abdomen and pelvis. No circumscribed abscess collection seen. No free intraperitoneal air. Majority of the small bowel is mildly distended, however, oral contrast has passed through the entire small bowel and colon without evidence of obstruction. Suspect some degree of small bowel ileus. Partial small bowel obstruction less likely. Thickening of the walls of the small bowel likely reactive to the adjacent ascites. Atherosclerotic changes seen along the walls of the normal-caliber abdominal aorta. Consolidations at each lung base are most likely atelectasis. There are also small pleural effusions bilaterally. Degenerative changes are seen throughout the thoracolumbar spine but no acute osseous abnormality. Ill-defined fluid/ edema is seen throughout the subcutaneous soft tissues consistent with anasarca. IMPRESSION: 1. Liver cirrhosis with associated large volume ascites throughout the abdomen and pelvis. 2. Mildly distended small bowel loops throughout the abdomen. Oral contrast was administered and has passed through the small and large bowel to the rectum thereby excluding a significant bowel obstruction. Suspect small bowel ileus. Partial small bowel obstruction less likely. 3. Thickening of the walls of the small bowel in the left abdomen and central abdomen, most likely reactive to the surrounding ascites.  Underlying enteritis cannot be excluded but is less likely. 4. Bibasilar consolidations, right greater than left, with adjacent small pleural effusions. These consolidations are favored to be atelectasis/airspace collapse. If febrile, pneumonia would be a possibility for the right lower lobe consolidation. 5. Cholelithiasis. 6. Anasarca Electronically Signed   By: Bary Richard M.D.   On: 12/27/2014 17:33   Ct Abdomen Pelvis Wo Contrast  12/21/2014  CLINICAL DATA:  Chronic substernal chest pain, radiating down to the periumbilical region, worse on the right. Initial encounter. EXAM: CT ABDOMEN AND PELVIS WITHOUT CONTRAST TECHNIQUE: Multidetector CT imaging of the abdomen and pelvis was performed following the standard protocol without IV contrast. COMPARISON:  Abdominal ultrasound 12/20/2014, and CT of the abdomen and pelvis from 12/15/2013 FINDINGS: Mild bibasilar atelectasis is noted. Trace pericardial  fluid remains within normal limits. Small to moderate volume ascites is seen within the abdomen and pelvis. There is a diffusely nodular contour to the liver, compatible with hepatic cirrhosis. No definite underlying mass is seen, though evaluation is limited without contrast. The spleen is borderline normal in size. Numerous small stones are seen dependently within the gallbladder. There is mild gallbladder wall thickening, nonspecific in the presence of ascites. The pancreas and adrenal glands are unremarkable in appearance. Mild nonspecific perinephric stranding is noted bilaterally. There is no evidence of hydronephrosis. No renal or ureteral stones are seen. No free fluid is identified. The small bowel is unremarkable in appearance. The stomach is within normal limits. No acute vascular abnormalities are seen. Scattered calcification is noted along the abdominal aorta and its branches. Prominent collateral vessels are noted underlying the right paracolic gutter. The patient is status post appendectomy.  There is mild diffuse wall thickening along the ascending colon. This is concerning for acute infectious or inflammatory colitis. The bladder is mildly distended and grossly unremarkable. The uterus is unremarkable in appearance. The ovaries are grossly symmetric. No suspicious adnexal masses are seen. No inguinal lymphadenopathy is seen. No acute osseous abnormalities are identified. Facet disease is noted at the lower lumbar spine. IMPRESSION: 1. Mild diffuse wall thickening along the ascending colon, reflecting acute infectious or inflammatory colitis. 2. Small to moderate volume ascites within the abdomen and pelvis. 3. Findings of hepatic cirrhosis. Prominent collateral vessels noted underlying the right paracolic gutter. 4. Cholelithiasis. Mild gallbladder wall thickening is nonspecific in the presence of ascites. 5. Scattered calcification along the abdominal aorta and its branches. 6. Mild bibasilar atelectasis noted. Electronically Signed   By: Roanna RaiderJeffery  Chang M.D.   On: 12/21/2014 01:43   Dg Chest 2 View  12/20/2014  CLINICAL DATA:  Midline chest pain shortness of breath for 2 months EXAM: CHEST - 2 VIEW COMPARISON:  12/15/2013 FINDINGS: Cardiac shadow is within normal limits. Minimal atelectatic changes are noted in the left lung base. No focal infiltrate or sizable effusion is seen. No bony abnormality is noted. IMPRESSION: Minimal left basilar atelectasis. Electronically Signed   By: Alcide CleverMark  Lukens M.D.   On: 12/20/2014 16:37   [place] Abdomen Complete  12/20/2014  CLINICAL DATA:  Abdominal pain EXAM: ULTRASOUND ABDOMEN COMPLETE COMPARISON:  01/19/2014 FINDINGS: Gallbladder: Multiple gallstones are again identified. No significant gallbladder wall thickening is noted. There is a positive sonographic Murphy sign however. Common bile duct: Diameter: 5 mm Liver: Mild heterogeneity as well as nodularity is noted likely representing underlying cirrhotic change IVC: No abnormality visualized. Pancreas:  Visualized portion unremarkable. Spleen: Size and appearance within normal limits. Right Kidney: Length: 8.8.  Mild cortical thinning is noted. Left Kidney: Length: 8.5.  Mild cortical thinning is noted. Abdominal aorta: No aneurysm visualized. Other findings: None. IMPRESSION: Changes of hepatic cirrhosis. Multiple gallstones with positive sonographic Murphy's sign. No wall thickening is noted. Mild cortical thinning of the kidneys bilaterally. Electronically Signed   By: Alcide CleverMark  Lukens M.D.   On: 12/20/2014 19:17   [place] Renal  12/21/2014  CLINICAL DATA:  Patient with acute renal failure. EXAM: RENAL / URINARY TRACT ULTRASOUND COMPLETE COMPARISON:  Abdominal ultrasound 12/21/2014 FINDINGS: Right Kidney: Length: 9.4 cm. No hydronephrosis. Mild increased renal cortical echogenicity. Mild renal cortical thinning. Left Kidney: Length: 9.9 cm. No hydronephrosis. Mild increased renal cortical echogenicity. Mild renal cortical thinning. Bladder: Appears normal for degree of bladder distention. Ascites. IMPRESSION: No hydronephrosis. Mild increased renal cortical echogenicity compatible with chronic  medical renal disease. Electronically Signed   By: Annia Belt M.D.   On: 12/21/2014 19:52   US Abdomen Limited  12/21/2014  CLINICAL DATA:  Spontaneous bacterial peritonitis. EXAM: LIMITED ABDOMEN ULTRASOUND FOR ASCITES TECHNIQUE: Limited ultrasound survey for ascites was performed in all four abdominal quadrants. COMPARISON:  Abdomen pelvis CT obtained earlier today. FINDINGS: Previously demonstrated small to moderate amount of free peritoneal fluid. No suitable location of fluid for paracentesis was found. IMPRESSION: Small to moderate amount of ascites without easily accessible fluid for paracentesis. Electronically Signed   By: Beckie Salts M.D.   On: 12/21/2014 10:34   US Paracentesis  12/28/2014  INDICATION: Ascites. EXAM: ULTRASOUND-GUIDED PARACENTESIS COMPARISON:  Previous para MEDICATIONS: 10 cc 1% lidocaine  COMPLICATIONS: None immediate TECHNIQUE: Informed written consent was obtained from the patient after a discussion of the risks, benefits and alternatives to treatment. A timeout was performed prior to the initiation of the procedure. Initial ultrasound scanning demonstrates a large amount of ascites within the right lower abdominal quadrant. The right lower abdomen was prepped and draped in the usual sterile fashion. 1% lidocaine with epinephrine was used for local anesthesia. Under direct ultrasound guidance, a 19 gauge, 7-cm, Yueh catheter was introduced. An ultrasound image was saved for documentation purposed. The paracentesis was performed. The catheter was removed and a dressing was applied. The patient tolerated the procedure well without immediate post procedural complication. FINDINGS: A total of approximately 2.7 liters of yellow fluid was removed. Samples were sent to the laboratory as requested by the clinical team. IMPRESSION: Successful ultrasound-guided paracentesis yielding 2.7 liters of peritoneal fluid. Read by:  Robet Leu [facility] Electronically Signed   By: Richarda Overlie M.D.   On: 12/28/2014 13:40   Dg Chest Port 1 View  12/21/2014  CLINICAL DATA:  65 year old female following central line placement. Evaluate proper placement. EXAM: PORTABLE CHEST 1 VIEW COMPARISON:  Chest x-ray 12/20/2014. FINDINGS: Interval placement of a left internal jugular central venous catheter with tip terminating at the superior cavoatrial junction. No pneumothorax. Lung volumes are low. There are some bibasilar opacities which are favored to reflect subsegmental atelectasis. No definite acute consolidative airspace disease. No pleural effusions. No evidence of pulmonary edema. Heart size is upper limits of normal, accentuated by low lung volumes and portable AP technique. Upper mediastinal contours are within normal limits. Atherosclerosis in the thoracic aorta. IMPRESSION: 1. Interval placement of left IJ central  venous catheter, which appears properly located with tip at the superior cavoatrial junction. 2. No pneumothorax or other acute complicating features. 3. Low lung volumes with bibasilar subsegmental atelectasis. Electronically Signed   By: Trudie Reed M.D.   On: 12/21/2014 18:53   Dg Abd Portable 1v  12/27/2014  CLINICAL DATA:  Ileus.  Abdominal pain EXAM: PORTABLE ABDOMEN - 1 VIEW COMPARISON:  12/23/2014 FINDINGS: Gaseous distension of the large and small bowel loops again noted. Compared with the previous exam the degree of bowel distention is not significantly changed. Gas is identified within the rectum. IMPRESSION: 1. No change in ileus pattern. Electronically Signed   By: Signa Kell M.D.   On: 12/27/2014 08:17   Dg Abd Portable 1v  12/23/2014  CLINICAL DATA:  Abdominal pain.  Nasogastric tube placement. EXAM: PORTABLE ABDOMEN - 1 VIEW COMPARISON:  12/23/2014 FINDINGS: A new nasogastric tube is seen with tip overlying the distal gastric antrum. Decrease gaseous distention of stomach is noted. IMPRESSION: Nasogastric tube tip overlies the distal gastric antrum. Electronically Signed   By:  Myles Rosenthal M.D.   On: 12/23/2014 20:04   Dg Abd Portable 1v  12/23/2014  CLINICAL DATA:  Acute abdominal pain, cirrhosis, COPD, CHF, asthma, chronic kidney disease EXAM: PORTABLE ABDOMEN - 1 VIEW COMPARISON:  03/09/2014 FINDINGS: Gaseous distention of stomach. Small amount retained contrast in colon. Otherwise normal bowel gas pattern. No bowel dilatation or bowel wall thickening otherwise seen. Bones demineralized. No definite urinary tract calcification. Inferior pelvis excluded. IMPRESSION: Gaseous distention of stomach. Otherwise normal bowel gas pattern. Electronically Signed   By: Ulyses Southward M.D.   On: 12/23/2014 08:42    Microbiology: Recent Results (from the past 240 hour(s))  Culture, body fluid-bottle     Status: None (Preliminary result)   Collection Time: 12/28/14 11:18 AM  Result Value  Ref Range Status   Specimen Description FLUID PERITONEAL  Final   Special Requests NONE  Final   Culture NO GROWTH 4 DAYS  Final   Report Status PENDING  Incomplete  Gram stain     Status: None   Collection Time: 12/28/14 11:18 AM  Result Value Ref Range Status   Specimen Description FLUID PERITONEAL  Final   Special Requests NONE  Final   Gram Stain   Final    MODERATE WBC PRESENT,BOTH PMN AND MONONUCLEAR NO ORGANISMS SEEN    Report Status 12/28/2014 FINAL  Final     Labs: Basic Metabolic Panel:  Recent Labs Lab 12/27/14 0445 12/28/14 0639 12/29/14 0550 12/30/14 1005 12/31/14 0400 01/01/15 1050  NA 135 136 134* 137 135 135  K 3.6 4.1 3.8 4.1 4.2 3.9  CL 102 104 105 106 107 108  CO2 22 19* 17* 16* 15* 15*  GLUCOSE 89 120* 112* 90 114* 128*  BUN 27* 32* 33* 38* 41* 44*  CREATININE 2.29* 3.16* 3.68* 4.58* 4.92* 5.61*  CALCIUM 9.0 9.3 8.4* 9.0 8.5* 8.3*  MG 2.0  --   --   --   --   --   PHOS 3.9  --   --   --  6.3*  --    Liver Function Tests:  Recent Labs Lab 12/26/14 0600 12/27/14 0445 12/28/14 0639 12/29/14 0550 12/31/14 0400  AST --   ALT 8* 9* 10* 9*  --   ALKPHOS 66 57 61 55  --   BILITOT 1.7* 1.9* 1.8* 1.5*  --   PROT 6.0* 6.2* 6.5 5.5*  --   ALBUMIN 2.1* 2.0* 2.1* 2.1* 2.7*   No results for input(s): LIPASE, AMYLASE in the last 168 hours. No results for input(s): AMMONIA in the last 168 hours. CBC:  Recent Labs Lab 12/28/14 0639 12/29/14 0044 12/30/14 1005 12/31/14 0400 01/01/15 1050  WBC 16.1* 12.1* 13.3* 12.2* 13.9*  HGB 8.2* 8.5* 8.3* 7.9* 8.2*  HCT 26.7* 26.3* 26.0* 25.1* 26.1*  MCV 79.5 80.2 80.2 79.4 80.1  PLT 349 244 216 218 228   Cardiac Enzymes: No results for input(s): CKTOTAL, CKMB, CKMBINDEX, TROPONINI in the last 168 hours. BNP: BNP (last 3 results)  Recent Labs  12/20/14 1600 12/22/14 0330  BNP 1051.1* 1281.2*    ProBNP (last 3 results) No results for input(s): PROBNP in the last 8760  hours.  CBG: No results for input(s): GLUCAP in the last 168 hours.     Signed:  Carolyne Littles, MD [facility] [phone] pager

## 2015-01-01 NOTE — Consult Note (Signed)
Consultation Note Date: 01/01/2015   Patient Name: Abigail Crawford  DOB: 1950/08/13  MRN: [id number]  Age / Sex: 63 y.o., female   PCP: Richmond Campbell, PA-C Referring Physician: Drema Dallas, MD  Reason for Consultation: Establishing goals of care, Non pain symptom management, Pain control and Psychosocial/spiritual support  Palliative Care Assessment and Plan Summary of Established Goals of Care and Medical Treatment Preferences    Palliative Care Discussion Held Today:    This NP Lorinda Creed reviewed medical records, received report from team, assessed the patient and then meet at the patient's bedside along with daughter Abigail Crawford and son Abigail Crawford and Abigail Crawford  to discuss diagnosis  (liver and renal disease), prognosis, GOC, EOL wishes disposition and options.  A detailed discussion was had today regarding advanced directives.  Concepts specific to code status, artifical feeding and hydration, continued IV antibiotics and rehospitalization was had.  The difference between a aggressive medical intervention path  and a palliative comfort care path for this patient at this time was had.  Values and goals of care important to patient and family were attempted to be elicited.  Concept of Hospice and Palliative Care were discussed  Natural trajectory and expectations at EOL were discussed.  Questions and concerns addressed.  Family encouraged to call with questions or concerns.  PMT will continue to support holistically.  Primary Decision Maker: daughter and son together  Goals of Care/Code Status/Advance Care Planning:   Code Status: DNR/DNI-comfort is main focus of care  Artificial feeding: none  Antibiotics:none  Diagnostics:none  Rehospitalization:avoid   Symptom Management:    Bowel regiment: Dulcolax supp prn daily  Pain/Dyspnea: Roxanol 5 mg po/sl every one hr prn  Agitation: Ativan 1 mg po/sl   Psycho-social/Spiritual:    Support System:  family  Desire for further Chaplaincy support:yes  Prognosis: < 2 weeks  Discharge Planning:  Home with Hospice, will write for choice       Chief Complaint: Abdominal pain  History of Present Illness:   64 y.o. WF PMH Anxiety, Alcoholic liver cirrhosis, nicotine abuse, COPD.  Admitted with 2 month history of lower chest/abdominal pain-worsening significantly . Found to be hypotensive with ARF on initial presentation.  Admitted for further workup and stabilization. Hospital stay complicated with sepsis, renal failure, hyponatremia.  Continued physical, functional and cognitive decline in spite of aggressive medical interventions.  Patient and family now wish to shift to a full comfort path and transition home with hospice  Primary Diagnoses  Present on Admission:  . Abdominal pain . Liver cirrhosis (HCC) . Hyponatremia . Hypotension . AKI (acute kidney injury) (HCC) . Encounter for central line placement . Acute abdominal pain . Infectious colitis . Severe sepsis (HCC) . Acute on chronic renal failure (HCC) . Microcytic anemia . Alcoholic cirrhosis of liver without ascites (HCC) . Other specified hypothyroidism . Abdominal pain, generalized . Alcoholic cirrhosis of liver with ascites (HCC) . Arterial hypotension . Ileus (HCC) . SBP (spontaneous bacterial peritonitis) (HCC)  Palliative Review of Systems:    -unable to illicit due to lethargy   I have reviewed the medical record, interviewed the patient and family, and examined the patient. The following aspects are pertinent.  Past Medical History  Diagnosis Date  . CHF (congestive heart failure) (HCC)   . Cirrhosis (HCC)   . COPD (chronic obstructive pulmonary disease) (HCC)   . Chronic kidney disease     "from cirrhosis of the liver"  . Anxiety   . Arthritis   .  GERD (gastroesophageal reflux disease)   . Asthma   . Migraines 02/28/11    "I have bad migraines"  . Ascitic fluid    Social History   Social  History  . Marital Status: Single    Spouse Name: N/A  . Number of Children: N/A  . Years of Education: N/A   Social History Main Topics  . Smoking status: Current Every Day Smoker -- 1.00 packs/day for 45 years    Types: Cigarettes  . Smokeless tobacco: Never Used  . Alcohol Use: 21.0 oz/week    35 Cans of beer per week     Comment: denies recent alcohol use  . Drug Use: No  . Sexual Activity: Yes   Other Topics Concern  . None   Social History Narrative   History reviewed. No pertinent family history. Scheduled Meds: . diltiazem  30 mg Oral 3 times per day  . folic acid  1 mg Oral Daily  . metoCLOPramide (REGLAN) injection  5 mg Intravenous Q12H  . midodrine  10 mg Oral TID WC  . pantoprazole (PROTONIX) IV  40 mg Intravenous Q12H  . piperacillin-tazobactam (ZOSYN)  IV  2.25 g Intravenous 3 times per day  . simethicone  80 mg Oral QID   Continuous Infusions: . octreotide  (SANDOSTATIN)    IV infusion 50 mcg/hr (01/01/15 0649)   PRN Meds:.ALPRAZolam, fentaNYL (SUBLIMAZE) injection, levalbuterol Medications Prior to Admission:  Prior to Admission medications   Medication Sig Start Date End Date Taking? Authorizing Provider  albuterol (PROVENTIL HFA;VENTOLIN HFA) 108 (90 BASE) MCG/ACT inhaler Inhale 2 puffs into the lungs every 4 (four) hours. Scheduled   Yes Historical Provider, MD  ALPRAZolam (XANAX) 0.5 MG tablet Take 0.5 mg by mouth 4 (four) times daily as needed for anxiety or sleep.    Yes Historical Provider, MD  esomeprazole (NEXIUM) 40 MG capsule Take 40 mg by mouth daily before breakfast.     Yes Historical Provider, MD  folic acid (FOLVITE) 1 MG tablet Take 1 mg by mouth daily.   Yes Historical Provider, MD  furosemide (LASIX) 20 MG tablet Take 20 mg by mouth daily.     Yes Historical Provider, MD  ibuprofen (ADVIL,MOTRIN) 200 MG tablet Take 200 mg by mouth every 4 (four) hours as needed for moderate pain.    Yes Historical Provider, MD  pantoprazole (PROTONIX) 40  MG tablet Take 40 mg by mouth daily.   Yes Historical Provider, MD  polyethylene glycol (MIRALAX / GLYCOLAX) packet Take 17 g by mouth at bedtime.    Yes Historical Provider, MD  propranolol (INDERAL) 10 MG tablet Take 10 mg by mouth daily.     Yes Historical Provider, MD  spironolactone (ALDACTONE) 25 MG tablet Take 25 mg by mouth daily.     Yes Historical Provider, MD  thiamine (VITAMIN B-1) 100 MG tablet Take 100 mg by mouth daily.   Yes Historical Provider, MD  traMADol (ULTRAM) 50 MG tablet Take 50 mg by mouth every 6 (six) hours. Maximum dose= 8 tablets per day   Yes Historical Provider, MD  traZODone (DESYREL) 50 MG tablet Take 50-100 mg by mouth at bedtime.   Yes Historical Provider, MD   Allergies  Allergen Reactions  . Tylenol [Acetaminophen] Other (See Comments)    Can not take with tramadol-not allergic  . Aspirin Rash  . Cortisone Rash and Other (See Comments)    Red spots.  . Tea Rash   CBC:    Component Value Date/Time  WBC 13.9* 01/01/2015 1050   HGB 8.2* 01/01/2015 1050   HCT 26.1* 01/01/2015 1050   PLT 228 01/01/2015 1050   MCV 80.1 01/01/2015 1050   NEUTROABS 8.5* 12/25/2014 0513   LYMPHSABS 1.1 12/25/2014 0513   MONOABS 1.4* 12/25/2014 0513   EOSABS 0.3 12/25/2014 0513   BASOSABS 0.0 12/25/2014 0513   Comprehensive Metabolic Panel:    Component Value Date/Time   NA 135 01/01/2015 1050   K 3.9 01/01/2015 1050   CL 108 01/01/2015 1050   CO2 15* 01/01/2015 1050   BUN 44* 01/01/2015 1050   CREATININE 5.61* 01/01/2015 1050   GLUCOSE 128* 01/01/2015 1050   CALCIUM 8.3* 01/01/2015 1050   AST 18 12/29/2014 0550   ALT 9* 12/29/2014 0550   ALKPHOS 55 12/29/2014 0550   BILITOT 1.5* 12/29/2014 0550   PROT 5.5* 12/29/2014 0550   ALBUMIN 2.7* 12/31/2014 0400    Physical Exam:  Vital Signs: BP 122/58 mmHg  Pulse 75  Temp(Src) 98.1 F (36.7 C) (Oral)  Resp 15  Ht 5\' 3"  (1.6 m)  Wt 53.8 kg (118 lb 9.7 oz)  BMI 21.02 kg/m2  SpO2 98% SpO2: SpO2: 98 % O2  Device: O2 Device: Nasal Cannula O2 Flow Rate: O2 Flow Rate (L/min): 1 L/min Intake/output summary:  Intake/Output Summary (Last 24 hours) at 01/01/15 1303 Last data filed at 01/01/15 0939  Gross per 24 hour  Intake    400 ml  Output    175 ml  Net    225 ml   LBM: Last BM Date: 12/31/14 Baseline Weight: Weight: 53.071 kg (117 lb) Most recent weight: Weight: 53.8 kg (118 lb 9.7 oz)  Exam Findings:   General: ill appearing HEENT: moist buccal mem CVS: RRR Resp: decreased in bases Abd: distended, firm, NT Skin: warm and dry Neuro: lethargic           Palliative Performance Scale: 20  %                Additional Data Reviewed: Recent Labs     12/31/14  0400  01/01/15  1050  WBC  12.2*  13.9*  HGB  7.9*  8.2*  PLT  218  228  NA  135  135  BUN  41*  44*  CREATININE  4.92*  5.61*     Time In: 1200 Time Out: 1330 min Time Total: 90 min  Greater than 50%  of this time was spent counseling and coordinating care related to the above assessment and plan.  Discussed with  Dr  Joseph ArtWoods  Signed by: Lorinda CreedLARACH, Cassadee Vanzandt, NP  Canary BrimMary W Octavia Velador, NP  01/01/2015, 1:03 PM  Please contact Palliative Medicine Team phone at [phone] for questions and concerns.   See AMION for contact information

## 2015-01-01 NOTE — Progress Notes (Signed)
Pt desats 88% room air; replaced O2 Pt came up to 95%.

## 2015-01-01 NOTE — Progress Notes (Signed)
Pt d/c home via [facility] transport. Family present & aware of TX. Home hospice arrangements made.

## 2015-01-02 LAB — CULTURE, BODY FLUID W GRAM STAIN -BOTTLE: Culture: NO GROWTH

## 2015-01-02 LAB — CULTURE, BODY FLUID-BOTTLE

## 2015-01-28 DEATH — deceased

## 2016-03-21 IMAGING — CR DG ABD PORTABLE 1V
1 series · 1 of 1 positions shown · non-contrast
Comparison: 12/23/2014

CLINICAL DATA: Abdominal pain.  Nasogastric tube placement.

EXAM:
PORTABLE ABDOMEN - 1 VIEW

[AP]
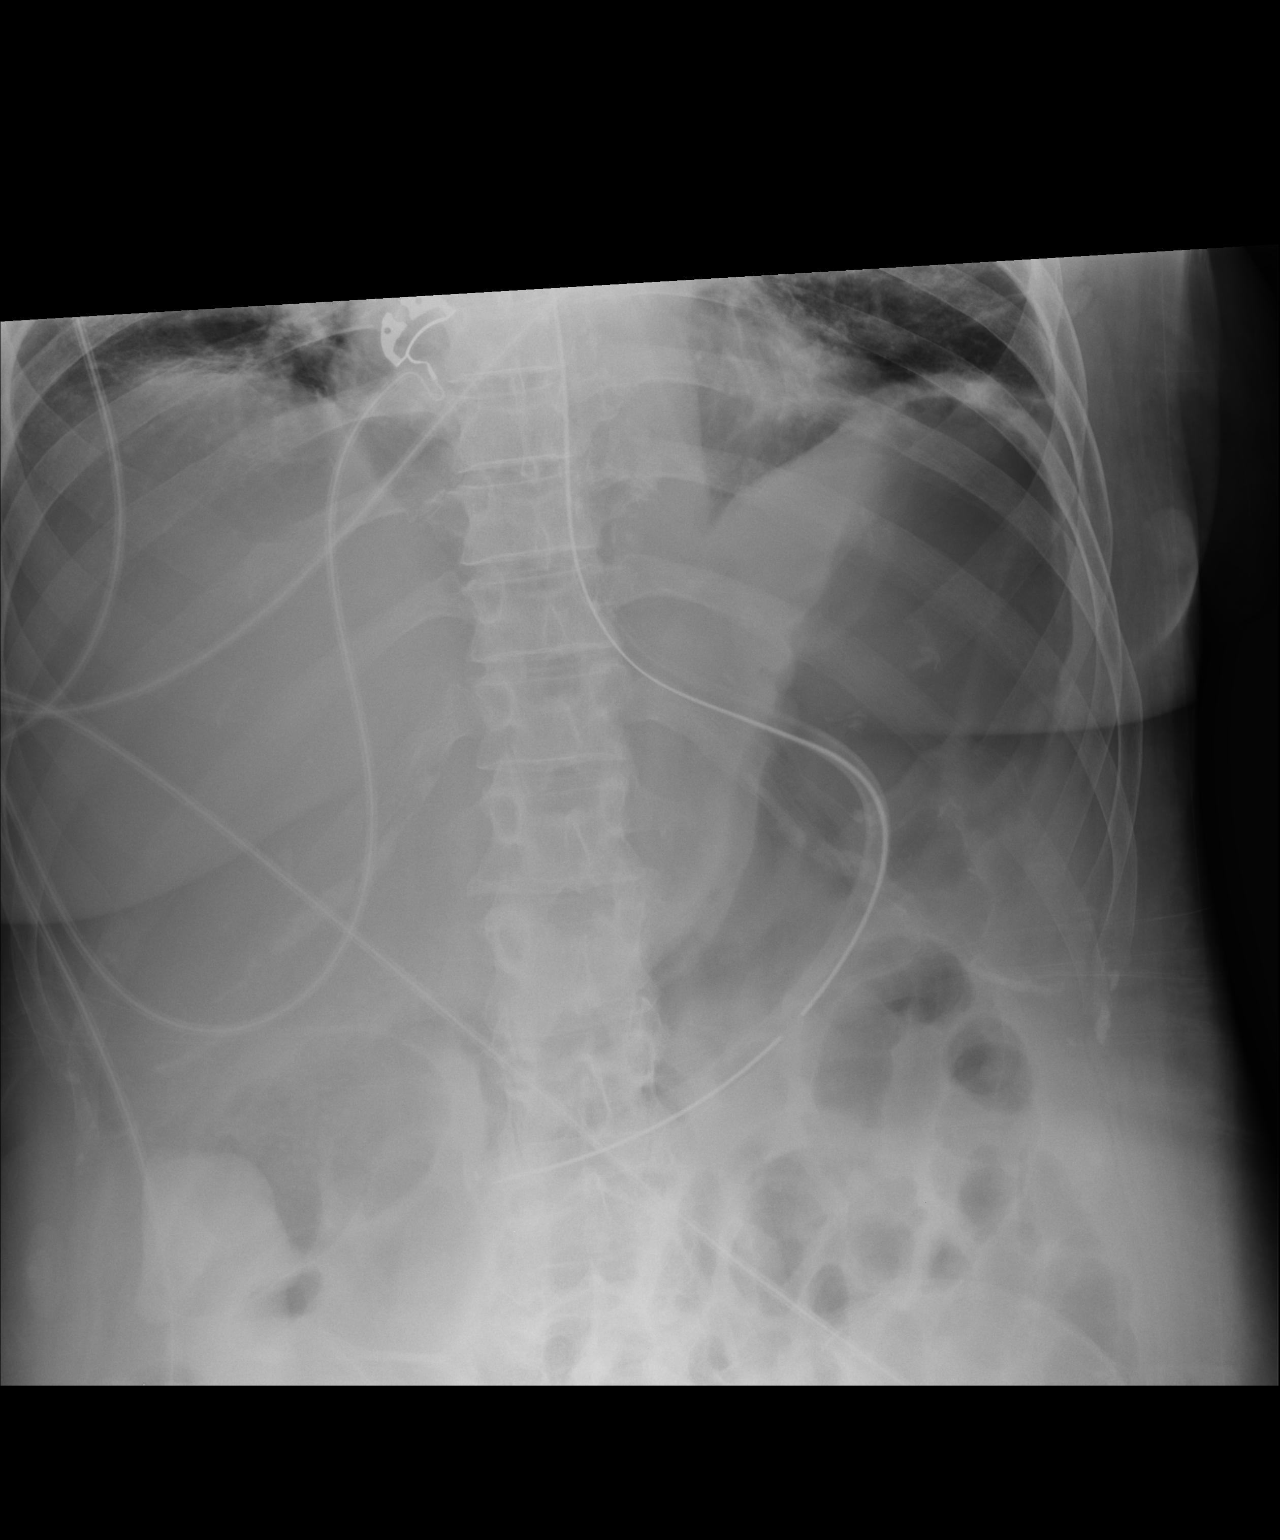

[1 of 1 positions shown; findings below may reference images not displayed]

FINDINGS: A new nasogastric tube is seen with tip overlying the distal gastric
antrum. Decrease gaseous distention of stomach is noted.
IMPRESSION: Nasogastric tube tip overlies the distal gastric antrum.
